# Patient Record
Sex: Female | Born: 1935 | Race: White | Hispanic: No | Smoking: Never smoker
Health system: Southern US, Community
[De-identification: ages and names within clinical notes are randomized; demographics above are authoritative.]

## PROBLEM LIST (undated history)

## (undated) DIAGNOSIS — I639 Cerebral infarction, unspecified: Secondary | ICD-10-CM

## (undated) DIAGNOSIS — N952 Postmenopausal atrophic vaginitis: Secondary | ICD-10-CM

## (undated) DIAGNOSIS — G441 Vascular headache, not elsewhere classified: Secondary | ICD-10-CM

## (undated) DIAGNOSIS — K5790 Diverticulosis of intestine, part unspecified, without perforation or abscess without bleeding: Secondary | ICD-10-CM

## (undated) DIAGNOSIS — M810 Age-related osteoporosis without current pathological fracture: Secondary | ICD-10-CM

## (undated) DIAGNOSIS — G459 Transient cerebral ischemic attack, unspecified: Secondary | ICD-10-CM

## (undated) DIAGNOSIS — B029 Zoster without complications: Secondary | ICD-10-CM

## (undated) DIAGNOSIS — N762 Acute vulvitis: Secondary | ICD-10-CM

## (undated) DIAGNOSIS — D099 Carcinoma in situ, unspecified: Secondary | ICD-10-CM

## (undated) DIAGNOSIS — E785 Hyperlipidemia, unspecified: Secondary | ICD-10-CM

## (undated) HISTORY — DX: Transient cerebral ischemic attack, unspecified: G45.9

## (undated) HISTORY — DX: Hyperlipidemia, unspecified: E78.5

## (undated) HISTORY — DX: Diverticulosis of intestine, part unspecified, without perforation or abscess without bleeding: K57.90

## (undated) HISTORY — DX: Acute vulvitis: N76.2

## (undated) HISTORY — DX: Gilbert syndrome: E80.4

## (undated) HISTORY — DX: Cerebral infarction, unspecified: I63.9

## (undated) HISTORY — DX: Age-related osteoporosis without current pathological fracture: M81.0

## (undated) HISTORY — DX: Vascular headache, not elsewhere classified: G44.1

## (undated) HISTORY — DX: Zoster without complications: B02.9

## (undated) HISTORY — DX: Postmenopausal atrophic vaginitis: N95.2

---
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
## 1997-11-03 ENCOUNTER — Other Ambulatory Visit: Admission: RE | Admit: 1997-11-03 | Discharge: 1997-11-03 | Payer: Self-pay | Admitting: *Deleted

## 1998-06-08 ENCOUNTER — Encounter: Payer: Self-pay | Admitting: Emergency Medicine

## 1998-06-08 ENCOUNTER — Emergency Department (HOSPITAL_COMMUNITY): Admission: EM | Admit: 1998-06-08 | Discharge: 1998-06-08 | Payer: Self-pay | Admitting: Emergency Medicine

## 1998-07-21 DIAGNOSIS — I639 Cerebral infarction, unspecified: Secondary | ICD-10-CM

## 1998-07-21 HISTORY — DX: Cerebral infarction, unspecified: I63.9

## 1999-01-09 ENCOUNTER — Other Ambulatory Visit: Admission: RE | Admit: 1999-01-09 | Discharge: 1999-01-09 | Payer: Self-pay | Admitting: *Deleted

## 1999-08-12 ENCOUNTER — Encounter: Admission: RE | Admit: 1999-08-12 | Discharge: 1999-08-12 | Payer: Self-pay | Admitting: *Deleted

## 1999-08-12 ENCOUNTER — Encounter: Payer: Self-pay | Admitting: *Deleted

## 2000-02-10 ENCOUNTER — Other Ambulatory Visit: Admission: RE | Admit: 2000-02-10 | Discharge: 2000-02-10 | Payer: Self-pay | Admitting: *Deleted

## 2000-08-18 ENCOUNTER — Encounter: Admission: RE | Admit: 2000-08-18 | Discharge: 2000-08-18 | Payer: Self-pay | Admitting: Internal Medicine

## 2000-08-18 ENCOUNTER — Encounter: Payer: Self-pay | Admitting: Internal Medicine

## 2001-02-15 ENCOUNTER — Other Ambulatory Visit: Admission: RE | Admit: 2001-02-15 | Discharge: 2001-02-15 | Payer: Self-pay | Admitting: *Deleted

## 2001-02-19 DIAGNOSIS — B029 Zoster without complications: Secondary | ICD-10-CM

## 2001-02-19 HISTORY — DX: Zoster without complications: B02.9

## 2001-08-17 ENCOUNTER — Encounter: Payer: Self-pay | Admitting: *Deleted

## 2001-08-17 ENCOUNTER — Encounter: Admission: RE | Admit: 2001-08-17 | Discharge: 2001-08-17 | Payer: Self-pay | Admitting: *Deleted

## 2002-02-21 ENCOUNTER — Other Ambulatory Visit: Admission: RE | Admit: 2002-02-21 | Discharge: 2002-02-21 | Payer: Self-pay | Admitting: *Deleted

## 2002-04-21 DIAGNOSIS — N762 Acute vulvitis: Secondary | ICD-10-CM

## 2002-04-21 HISTORY — DX: Acute vulvitis: N76.2

## 2002-08-22 ENCOUNTER — Encounter: Payer: Self-pay | Admitting: *Deleted

## 2002-08-22 ENCOUNTER — Encounter: Admission: RE | Admit: 2002-08-22 | Discharge: 2002-08-22 | Payer: Self-pay | Admitting: *Deleted

## 2003-03-08 ENCOUNTER — Other Ambulatory Visit: Admission: RE | Admit: 2003-03-08 | Discharge: 2003-03-08 | Payer: Self-pay | Admitting: *Deleted

## 2003-04-22 HISTORY — PX: CATARACT EXTRACTION: SUR2

## 2003-08-24 ENCOUNTER — Encounter: Admission: RE | Admit: 2003-08-24 | Discharge: 2003-08-24 | Payer: Self-pay | Admitting: Internal Medicine

## 2004-08-26 ENCOUNTER — Encounter: Admission: RE | Admit: 2004-08-26 | Discharge: 2004-08-26 | Payer: Self-pay | Admitting: *Deleted

## 2005-03-26 ENCOUNTER — Other Ambulatory Visit: Admission: RE | Admit: 2005-03-26 | Discharge: 2005-03-26 | Payer: Self-pay | Admitting: Obstetrics and Gynecology

## 2005-08-28 ENCOUNTER — Encounter: Admission: RE | Admit: 2005-08-28 | Discharge: 2005-08-28 | Payer: Self-pay | Admitting: Internal Medicine

## 2005-09-10 ENCOUNTER — Encounter: Admission: RE | Admit: 2005-09-10 | Discharge: 2005-09-10 | Payer: Self-pay | Admitting: Internal Medicine

## 2005-09-17 ENCOUNTER — Encounter: Admission: RE | Admit: 2005-09-17 | Discharge: 2005-09-17 | Payer: Self-pay | Admitting: Neurology

## 2006-08-11 ENCOUNTER — Other Ambulatory Visit: Admission: RE | Admit: 2006-08-11 | Discharge: 2006-08-11 | Payer: Self-pay | Admitting: Obstetrics and Gynecology

## 2006-09-15 ENCOUNTER — Encounter: Admission: RE | Admit: 2006-09-15 | Discharge: 2006-09-15 | Payer: Self-pay | Admitting: Obstetrics and Gynecology

## 2006-10-21 ENCOUNTER — Ambulatory Visit: Payer: Self-pay | Admitting: Internal Medicine

## 2006-11-06 ENCOUNTER — Ambulatory Visit: Payer: Self-pay | Admitting: Internal Medicine

## 2007-08-20 ENCOUNTER — Other Ambulatory Visit: Admission: RE | Admit: 2007-08-20 | Discharge: 2007-08-20 | Payer: Self-pay | Admitting: Obstetrics and Gynecology

## 2007-09-20 ENCOUNTER — Encounter: Admission: RE | Admit: 2007-09-20 | Discharge: 2007-09-20 | Payer: Self-pay | Admitting: Internal Medicine

## 2008-09-26 ENCOUNTER — Encounter: Admission: RE | Admit: 2008-09-26 | Discharge: 2008-09-26 | Payer: Self-pay | Admitting: Internal Medicine

## 2008-10-12 ENCOUNTER — Encounter: Admission: RE | Admit: 2008-10-12 | Discharge: 2008-10-12 | Payer: Self-pay | Admitting: Neurology

## 2008-11-22 ENCOUNTER — Encounter: Admission: RE | Admit: 2008-11-22 | Discharge: 2009-02-20 | Payer: Self-pay | Admitting: Internal Medicine

## 2009-03-09 ENCOUNTER — Encounter: Admission: RE | Admit: 2009-03-09 | Discharge: 2009-04-17 | Payer: Self-pay | Admitting: Internal Medicine

## 2009-09-26 ENCOUNTER — Encounter: Admission: RE | Admit: 2009-09-26 | Discharge: 2009-09-26 | Payer: Self-pay | Admitting: Internal Medicine

## 2010-01-03 ENCOUNTER — Encounter: Admission: RE | Admit: 2010-01-03 | Discharge: 2010-01-03 | Payer: Self-pay | Admitting: Internal Medicine

## 2010-05-12 ENCOUNTER — Encounter: Payer: Self-pay | Admitting: Internal Medicine

## 2010-07-15 ENCOUNTER — Other Ambulatory Visit: Payer: Self-pay | Admitting: Rheumatology

## 2010-07-15 ENCOUNTER — Ambulatory Visit
Admission: RE | Admit: 2010-07-15 | Discharge: 2010-07-15 | Disposition: A | Discharge status: Home / Self Care | Payer: Medicare Other | Source: Ambulatory Visit | Attending: Rheumatology | Admitting: Rheumatology

## 2010-09-17 ENCOUNTER — Other Ambulatory Visit: Payer: Self-pay | Admitting: Obstetrics & Gynecology

## 2010-09-17 DIAGNOSIS — N6321 Unspecified lump in the left breast, upper outer quadrant: Secondary | ICD-10-CM

## 2010-10-03 ENCOUNTER — Other Ambulatory Visit: Payer: Self-pay | Admitting: Obstetrics & Gynecology

## 2010-10-03 DIAGNOSIS — N6321 Unspecified lump in the left breast, upper outer quadrant: Secondary | ICD-10-CM

## 2010-10-10 ENCOUNTER — Ambulatory Visit
Admission: RE | Admit: 2010-10-10 | Discharge: 2010-10-10 | Disposition: A | Discharge status: Home / Self Care | Payer: Medicare Other | Source: Ambulatory Visit | Attending: Obstetrics & Gynecology | Admitting: Obstetrics & Gynecology

## 2010-10-10 DIAGNOSIS — N6321 Unspecified lump in the left breast, upper outer quadrant: Secondary | ICD-10-CM

## 2010-12-04 ENCOUNTER — Other Ambulatory Visit: Payer: Self-pay | Admitting: Obstetrics & Gynecology

## 2010-12-04 DIAGNOSIS — Z1231 Encounter for screening mammogram for malignant neoplasm of breast: Secondary | ICD-10-CM

## 2011-01-07 ENCOUNTER — Ambulatory Visit: Payer: Medicare Other

## 2011-01-14 ENCOUNTER — Emergency Department (HOSPITAL_COMMUNITY): Payer: Medicare Other

## 2011-01-14 ENCOUNTER — Inpatient Hospital Stay (HOSPITAL_COMMUNITY)
Admission: EM | Admit: 2011-01-14 | Discharge: 2011-01-20 | DRG: 563 | Disposition: A | Discharge status: Skilled Nursing Facility | Payer: Medicare Other | Attending: Internal Medicine | Admitting: Internal Medicine

## 2011-01-14 DIAGNOSIS — S42293A Other displaced fracture of upper end of unspecified humerus, initial encounter for closed fracture: Principal | ICD-10-CM | POA: Diagnosis present

## 2011-01-14 DIAGNOSIS — Z23 Encounter for immunization: Secondary | ICD-10-CM

## 2011-01-14 DIAGNOSIS — M19019 Primary osteoarthritis, unspecified shoulder: Secondary | ICD-10-CM | POA: Diagnosis present

## 2011-01-14 DIAGNOSIS — Y92009 Unspecified place in unspecified non-institutional (private) residence as the place of occurrence of the external cause: Secondary | ICD-10-CM

## 2011-01-14 DIAGNOSIS — R55 Syncope and collapse: Secondary | ICD-10-CM | POA: Diagnosis present

## 2011-01-14 DIAGNOSIS — W19XXXA Unspecified fall, initial encounter: Secondary | ICD-10-CM | POA: Diagnosis present

## 2011-01-14 DIAGNOSIS — I10 Essential (primary) hypertension: Secondary | ICD-10-CM | POA: Diagnosis present

## 2011-01-14 DIAGNOSIS — Y93K1 Activity, walking an animal: Secondary | ICD-10-CM

## 2011-01-14 DIAGNOSIS — K5900 Constipation, unspecified: Secondary | ICD-10-CM | POA: Diagnosis not present

## 2011-01-14 DIAGNOSIS — Z7982 Long term (current) use of aspirin: Secondary | ICD-10-CM

## 2011-01-14 DIAGNOSIS — E785 Hyperlipidemia, unspecified: Secondary | ICD-10-CM | POA: Diagnosis present

## 2011-01-14 DIAGNOSIS — Z79899 Other long term (current) drug therapy: Secondary | ICD-10-CM

## 2011-01-15 ENCOUNTER — Emergency Department (HOSPITAL_COMMUNITY): Payer: Medicare Other

## 2011-01-15 DIAGNOSIS — R55 Syncope and collapse: Secondary | ICD-10-CM

## 2011-01-15 LAB — DIFFERENTIAL
Basophils Relative: 0 % (ref 0–1)
Eosinophils Absolute: 0.1 10*3/uL (ref 0.0–0.7)
Eosinophils Relative: 1 % (ref 0–5)
Lymphocytes Relative: 15 % (ref 12–46)
Lymphs Abs: 2.7 10*3/uL (ref 0.7–4.0)
Monocytes Relative: 9 % (ref 3–12)
Neutro Abs: 13.4 10*3/uL — ABNORMAL HIGH (ref 1.7–7.7)
Neutrophils Relative %: 75 % (ref 43–77)

## 2011-01-15 LAB — CBC
HCT: 38 % (ref 36.0–46.0)
MCH: 29.3 pg (ref 26.0–34.0)
MCHC: 32.4 g/dL (ref 30.0–36.0)
MCHC: 32.7 g/dL (ref 30.0–36.0)
MCV: 90.5 fL (ref 78.0–100.0)
Platelets: 222 10*3/uL (ref 150–400)
RBC: 4.68 MIL/uL (ref 3.87–5.11)
RDW: 14.3 % (ref 11.5–15.5)
RDW: 14.4 % (ref 11.5–15.5)
WBC: 11.2 10*3/uL — ABNORMAL HIGH (ref 4.0–10.5)

## 2011-01-15 LAB — BASIC METABOLIC PANEL
BUN: 23 mg/dL (ref 6–23)
CO2: 29 mEq/L (ref 19–32)
Calcium: 8.8 mg/dL (ref 8.4–10.5)
Calcium: 9.2 mg/dL (ref 8.4–10.5)
Chloride: 105 mEq/L (ref 96–112)
Creatinine, Ser: 0.71 mg/dL (ref 0.50–1.10)
Creatinine, Ser: 0.95 mg/dL (ref 0.50–1.10)
GFR calc Af Amer: >60 mL/min (ref >60)
GFR calc Af Amer: >60 mL/min (ref >60)
Sodium: 142 mEq/L (ref 135–145)

## 2011-01-15 LAB — URINALYSIS, ROUTINE W REFLEX MICROSCOPIC
Bilirubin Urine: NEGATIVE
Glucose, UA: NEGATIVE mg/dL
Hgb urine dipstick: NEGATIVE
Ketones, ur: 15 mg/dL — AB
Protein, ur: NEGATIVE mg/dL
Urobilinogen, UA: 0.2 mg/dL (ref 0.0–1.0)

## 2011-01-15 LAB — CK TOTAL AND CKMB (NOT AT ARMC)
CK, MB: 2.3 ng/mL (ref 0.3–4.0)
Total CK: 66 U/L (ref 7–177)

## 2011-01-15 LAB — D-DIMER, QUANTITATIVE: D-Dimer, Quant: 7.22 ug/mL-FEU — ABNORMAL HIGH (ref 0.00–0.48)

## 2011-01-15 LAB — POCT I-STAT TROPONIN I

## 2011-01-15 LAB — LIPID PANEL: Total CHOL/HDL Ratio: 2.3 RATIO

## 2011-01-15 LAB — TSH: TSH: 2.727 u[IU]/mL (ref 0.350–4.500)

## 2011-01-15 MED ORDER — IOHEXOL 300 MG/ML  SOLN
100.0000 mL | Freq: Once | INTRAMUSCULAR | Status: AC | PRN
  Administered 2011-01-15: 100 mL via INTRAVENOUS

## 2011-01-15 NOTE — H&P (Signed)
[name], NISTLER               ACCOUNT NO.:  [id number]  MEDICAL RECORD NO.:  [id number]  LOCATION:  [facility]                         FACILITY:  [facility]  PHYSICIAN:  Gery Pray, MD      DATE OF BIRTH:  22-Jul-1925  DATE OF ADMISSION:  01/14/2011 DATE OF DISCHARGE:                             HISTORY & PHYSICAL   PRIMARY CARE PHYSICIAN:  Soyla Murphy. [name], MD  The patient goes to team 4.  CHIEF COMPLAINT:  Fall.  HISTORY OF PRESENT ILLNESS:  This is a rather pleasant, active 77 year old female who lives alone.  She states that today she was out walking her kittens, she saw something, bent over to get it, and lost consciousness.  She is unclear how long she was down for.  It was a few seconds or a few minutes.  She reports that prior to this she had no fevers, no chills.  No nausea or vomiting.  No altered mental status. No burning urination.  No cough.  No localized weakness.  No slurred speech.  No dysphagia after the episode.  She has no chest pain.  She states she has been feeling fairly tired recently.  She has a decreased appetite.  She states she believes she felt a little presyncopal 2 weeks ago.    911 was called.  She was brought to the ER.  She is unclear who called 911.   She states she is on no new medications.  No change in medications.  History obtained from the patient who appears reliable.  REVIEW OF SYSTEMS:  All 10-point systems was reviewed and negative except as noted in HPI.  PAST MEDICAL HISTORY:  Dyslipidemia.  PAST SURGICAL HISTORY:  None.  MEDICATIONS:  Crestor and aspirin, multivitamins.  ALLERGIES:  PENICILLIN.  SOCIAL HISTORY:  Negative for tobacco, alcohol, or illicit drugs.  The patient lives alone.  She does not have home oxygen.  She uses a walker or a cane occasionally.  She gets meals from [employer].  She has no other support.  FAMILY HISTORY:  Significant for hypertension, CVA.  PHYSICAL EXAMINATION:  VITAL SIGNS:  Blood  pressure 142/118 repeat 142/67, pulse 78, respirations 20, temperature 98.2, sat is 97% on room air. GENERAL:  Alert, oriented, pleasant female. HEENT:  Eyes, pink conjunctivae.  PERRLA.  ENT: Moist oral mucosa. Trachea midline. NECK:  Supple.  No thyromegaly. LUNGS:  Clear to auscultation bilaterally.  No use of accessory muscles. No wheeze. CARDIOVASCULAR:  Regular rate and rhythm without murmurs, rigors, or gallops.  No JVD.  No carotid bruits. ABDOMEN:  Soft, positive bowel sounds, nontender, nondistended.  No organomegaly. NEURO:  Cranial nerves II through XII grossly intact.  Sensation intact. MUSCULOSKELETAL:  Right upper extremity strength not assessed due to fracture, otherwise 5/5 in all extremities.  No clubbing, cyanosis, or edema.  The patient's right upper extremity does have good grip. SKIN:  No rashes.  No subcutaneous crepitation. PSYCH:  Alert, oriented appropriate female.  LABORATORY DATA:  Right shoulder x-ray shows marked right shoulder DJD. Right humerus shows mild displaced fracture to the surgical neck, DJD again noted as well as a large osteophyte at the humeral head.  Chest x- ray shows mild bibasilar atelectasis, otherwise normal.  There is also a question of right ninth rib fracture, likely mild chronic compression fracture along the low thoracic spine.  CT head, no evidence of traumatic intracranial injury.  Cervical spine series shows no evidence of fracture, subluxation.  CT head shows no evidence of intracranial fracture.  CT chest, anterior is negative for PE.  White blood count 17.9, hemoglobin 13.9, platelets 244, troponin 0.01.  Sodium 142, potassium 3.8, chloride 104, CO2 of 29, glucose 113, BUN 28, creatinine 0.95.  EKG normal sinus rhythm.  ASSESSMENT AND PLAN: 1. Syncope, unclear etiology.  The patient will be admitted for     syncopal workup which will include carotid ultrasound and monitor     on tele.  The patient already received CT  head. 2. Right humeral fracture.  The ER physician did contact the cross     cover for Dr. Shelle Iron who states that there is nothing much to do.     The patient currently is in a sling.  Recommend outpatient followup     with Dr. Shelle Iron in 1 week.  The patient lives alone.  Therefore     activities may be an issue.  Therefore, PT will be consulted.     Social worker came afterwards to evaluate for placement home versus     assisted living or otherwise. 3. Dyslipidemia.  Continue statins.  Pain medications will be ordered. 4. Marked osteoarthritis.  Pain medications will be ordered.          ______________________________ Gery Pray, MD     DC/MEDQ  D:  01/15/2011  T:  01/15/2011  Job:  [id number]  Electronically Signed by Gery Pray MD on 01/15/2011 05:59:01 AM

## 2011-01-16 LAB — BASIC METABOLIC PANEL WITH GFR
BUN: 21 mg/dL (ref 6–23)
CO2: 29 meq/L (ref 19–32)
Calcium: 8.9 mg/dL (ref 8.4–10.5)
Chloride: 106 meq/L (ref 96–112)
Creatinine, Ser: 0.66 mg/dL (ref 0.50–1.10)
GFR calc Af Amer: >60 mL/min
GFR calc non Af Amer: >60 mL/min
Glucose, Bld: 106 mg/dL — ABNORMAL HIGH (ref 70–99)
Potassium: 4.1 meq/L (ref 3.5–5.1)
Sodium: 141 meq/L (ref 135–145)

## 2011-01-16 LAB — CBC
HCT: 38.2 % (ref 36.0–46.0)
Hemoglobin: 12 g/dL (ref 12.0–15.0)
MCH: 28.5 pg (ref 26.0–34.0)
MCHC: 31.4 g/dL (ref 30.0–36.0)
MCV: 90.7 fL (ref 78.0–100.0)
Platelets: 201 10*3/uL (ref 150–400)
RBC: 4.21 MIL/uL (ref 3.87–5.11)
RDW: 14.4 % (ref 11.5–15.5)
WBC: 10.4 10*3/uL (ref 4.0–10.5)

## 2011-01-22 NOTE — Discharge Summary (Signed)
  [name], [name]               ACCOUNT NO.:  [id number]  MEDICAL RECORD NO.:  [id number]  LOCATION:  3701                         FACILITY:  [facility]  PHYSICIAN:  Caroline Ranger, MD       DATE OF BIRTH:  10-04-1925  DATE OF ADMISSION:  01/14/2011 DATE OF DISCHARGE:                              DISCHARGE SUMMARY   PRIMARY CARE PHYSICIAN:  Caroline Murphy. [name], MD  DISCHARGE FINAL DIAGNOSES: 1. Syncope probable vasovagal episode. 2. Fall with right humeral fracture. 3. Dyslipidemia. 4. Osteoarthritis. 5. Constipation.  DISCHARGE MEDICATIONS: 1. Acetaminophen 650 mg p.o. Curtis 4 h. as needed. 2. Percocet 7.5/500 one tablet p.o. Curtis 6 h. as needed for pain. 3. MiraLax 17 g p.o. daily as needed for constipation. 4. Senokot 2 tabs p.o. daily as needed for constipation. 5. Aspirin 81 mg p.o. daily. 6. Crestor 1 tablet p.o. daily. 7. Fish oil 1 tablet p.o. daily. 8. Mirtazapine 15 mg p.o. daily at bedtime. 9. Multivitamin 1 tablet p.o. daily.  REVIEW OF SYSTEMS:  Please refer to the previously dictated discharge summaries by Dr. Mliss Sax.  For details of the hospitalization, the patient was awaiting the short-term rehab bed availability.  In the interim, the patient also had constipation which was resolved with the MiraLax.  The patient will be discharged to the rehab with home made services.  Right humerus fracture.  The patient will follow up with [name] in outpatient.  She will continue to wear a sling and pain control and physical therapy.  Vital signs at the time of my dictation; temperature 97.9, pulse 62, respirations 20, blood pressure 134/72 and O2 sats 93% on room air. General, the patient is alert, awake and oriented x3 not in acute distress.  Pupils reactive to light and accommodation.  EOMI.  Neck is supple.  No lymphadenopathy.  CVS, clear.  Chest clear to auscultation bilaterally.  Abdomen soft, nontender, nondistended and normoactive bowel sounds.   Extremities right arm in sling.  No sinus, clubbing or edema noted in upper or lower extremities bilaterally.  DISCHARGE FOLLOWUP:  With [name] on January 27, 2011, at 8 a.m. and discharge followup with primary care physician Dr. Merri Curtis in next 2 weeks for the followup.  DISCHARGE TIME:  35 minutes.     Caroline Ranger, MD     RR/MEDQ  D:  01/20/2011  T:  01/20/2011  Job:  [id number]  cc:   Jene Curtis, M.D. [name], M.D.  Electronically Signed by [name]  on 01/22/2011 03:16:06 PM

## 2011-04-28 DIAGNOSIS — F070 Personality change due to known physiological condition: Secondary | ICD-10-CM | POA: Diagnosis not present

## 2011-05-02 ENCOUNTER — Ambulatory Visit (INDEPENDENT_AMBULATORY_CARE_PROVIDER_SITE_OTHER): Payer: Medicare Other | Admitting: Ophthalmology

## 2011-05-02 DIAGNOSIS — H3530 Unspecified macular degeneration: Secondary | ICD-10-CM | POA: Diagnosis not present

## 2011-05-02 DIAGNOSIS — H43819 Vitreous degeneration, unspecified eye: Secondary | ICD-10-CM

## 2011-05-07 ENCOUNTER — Other Ambulatory Visit: Payer: Self-pay | Admitting: Internal Medicine

## 2011-05-07 DIAGNOSIS — R413 Other amnesia: Secondary | ICD-10-CM

## 2011-05-12 DIAGNOSIS — M47817 Spondylosis without myelopathy or radiculopathy, lumbosacral region: Secondary | ICD-10-CM | POA: Diagnosis not present

## 2011-05-13 ENCOUNTER — Ambulatory Visit
Admission: RE | Admit: 2011-05-13 | Discharge: 2011-05-13 | Disposition: A | Discharge status: Home / Self Care | Payer: Medicare Other | Source: Ambulatory Visit | Attending: Internal Medicine | Admitting: Internal Medicine

## 2011-05-13 DIAGNOSIS — R413 Other amnesia: Secondary | ICD-10-CM | POA: Diagnosis not present

## 2011-05-13 DIAGNOSIS — G319 Degenerative disease of nervous system, unspecified: Secondary | ICD-10-CM | POA: Diagnosis not present

## 2011-05-13 DIAGNOSIS — I6789 Other cerebrovascular disease: Secondary | ICD-10-CM | POA: Diagnosis not present

## 2011-06-05 DIAGNOSIS — F411 Generalized anxiety disorder: Secondary | ICD-10-CM | POA: Diagnosis not present

## 2011-06-05 DIAGNOSIS — R413 Other amnesia: Secondary | ICD-10-CM | POA: Diagnosis not present

## 2011-07-15 DIAGNOSIS — H04129 Dry eye syndrome of unspecified lacrimal gland: Secondary | ICD-10-CM | POA: Diagnosis not present

## 2011-08-06 DIAGNOSIS — G2581 Restless legs syndrome: Secondary | ICD-10-CM | POA: Diagnosis not present

## 2011-08-06 DIAGNOSIS — I8390 Asymptomatic varicose veins of unspecified lower extremity: Secondary | ICD-10-CM | POA: Diagnosis not present

## 2011-09-04 DIAGNOSIS — R413 Other amnesia: Secondary | ICD-10-CM | POA: Diagnosis not present

## 2011-09-04 DIAGNOSIS — F411 Generalized anxiety disorder: Secondary | ICD-10-CM | POA: Diagnosis not present

## 2011-09-04 DIAGNOSIS — E7800 Pure hypercholesterolemia, unspecified: Secondary | ICD-10-CM | POA: Diagnosis not present

## 2011-09-25 DIAGNOSIS — Z124 Encounter for screening for malignant neoplasm of cervix: Secondary | ICD-10-CM | POA: Diagnosis not present

## 2011-09-25 DIAGNOSIS — B3731 Acute candidiasis of vulva and vagina: Secondary | ICD-10-CM | POA: Diagnosis not present

## 2011-09-25 DIAGNOSIS — B373 Candidiasis of vulva and vagina: Secondary | ICD-10-CM | POA: Diagnosis not present

## 2011-09-25 DIAGNOSIS — Z01419 Encounter for gynecological examination (general) (routine) without abnormal findings: Secondary | ICD-10-CM | POA: Diagnosis not present

## 2011-12-02 DIAGNOSIS — M25819 Other specified joint disorders, unspecified shoulder: Secondary | ICD-10-CM | POA: Diagnosis not present

## 2011-12-02 DIAGNOSIS — M25519 Pain in unspecified shoulder: Secondary | ICD-10-CM | POA: Diagnosis not present

## 2011-12-02 DIAGNOSIS — M19019 Primary osteoarthritis, unspecified shoulder: Secondary | ICD-10-CM | POA: Diagnosis not present

## 2011-12-09 DIAGNOSIS — M25519 Pain in unspecified shoulder: Secondary | ICD-10-CM | POA: Diagnosis not present

## 2011-12-16 DIAGNOSIS — M19019 Primary osteoarthritis, unspecified shoulder: Secondary | ICD-10-CM | POA: Diagnosis not present

## 2011-12-18 DIAGNOSIS — M4120 Other idiopathic scoliosis, site unspecified: Secondary | ICD-10-CM | POA: Diagnosis not present

## 2011-12-18 DIAGNOSIS — M4716 Other spondylosis with myelopathy, lumbar region: Secondary | ICD-10-CM | POA: Diagnosis not present

## 2011-12-18 DIAGNOSIS — M5137 Other intervertebral disc degeneration, lumbosacral region: Secondary | ICD-10-CM | POA: Diagnosis not present

## 2011-12-18 DIAGNOSIS — M25519 Pain in unspecified shoulder: Secondary | ICD-10-CM | POA: Diagnosis not present

## 2012-01-06 DIAGNOSIS — M4716 Other spondylosis with myelopathy, lumbar region: Secondary | ICD-10-CM | POA: Diagnosis not present

## 2012-01-06 DIAGNOSIS — M25519 Pain in unspecified shoulder: Secondary | ICD-10-CM | POA: Diagnosis not present

## 2012-01-06 DIAGNOSIS — M4120 Other idiopathic scoliosis, site unspecified: Secondary | ICD-10-CM | POA: Diagnosis not present

## 2012-01-06 DIAGNOSIS — M5137 Other intervertebral disc degeneration, lumbosacral region: Secondary | ICD-10-CM | POA: Diagnosis not present

## 2012-01-19 DIAGNOSIS — M545 Low back pain, unspecified: Secondary | ICD-10-CM | POA: Diagnosis not present

## 2012-01-27 DIAGNOSIS — M25519 Pain in unspecified shoulder: Secondary | ICD-10-CM | POA: Diagnosis not present

## 2012-01-27 DIAGNOSIS — M4120 Other idiopathic scoliosis, site unspecified: Secondary | ICD-10-CM | POA: Diagnosis not present

## 2012-01-27 DIAGNOSIS — M4716 Other spondylosis with myelopathy, lumbar region: Secondary | ICD-10-CM | POA: Diagnosis not present

## 2012-01-27 DIAGNOSIS — M5137 Other intervertebral disc degeneration, lumbosacral region: Secondary | ICD-10-CM | POA: Diagnosis not present

## 2012-02-20 DIAGNOSIS — Q675 Congenital deformity of spine: Secondary | ICD-10-CM | POA: Diagnosis not present

## 2012-02-20 DIAGNOSIS — M5137 Other intervertebral disc degeneration, lumbosacral region: Secondary | ICD-10-CM | POA: Diagnosis not present

## 2012-02-26 DIAGNOSIS — Q675 Congenital deformity of spine: Secondary | ICD-10-CM | POA: Diagnosis not present

## 2012-02-26 DIAGNOSIS — M5137 Other intervertebral disc degeneration, lumbosacral region: Secondary | ICD-10-CM | POA: Diagnosis not present

## 2012-02-27 DIAGNOSIS — Q675 Congenital deformity of spine: Secondary | ICD-10-CM | POA: Diagnosis not present

## 2012-02-27 DIAGNOSIS — M5137 Other intervertebral disc degeneration, lumbosacral region: Secondary | ICD-10-CM | POA: Diagnosis not present

## 2012-03-01 DIAGNOSIS — Q675 Congenital deformity of spine: Secondary | ICD-10-CM | POA: Diagnosis not present

## 2012-03-01 DIAGNOSIS — M5137 Other intervertebral disc degeneration, lumbosacral region: Secondary | ICD-10-CM | POA: Diagnosis not present

## 2012-03-03 DIAGNOSIS — Q675 Congenital deformity of spine: Secondary | ICD-10-CM | POA: Diagnosis not present

## 2012-03-03 DIAGNOSIS — M5137 Other intervertebral disc degeneration, lumbosacral region: Secondary | ICD-10-CM | POA: Diagnosis not present

## 2012-03-09 DIAGNOSIS — M4120 Other idiopathic scoliosis, site unspecified: Secondary | ICD-10-CM | POA: Diagnosis not present

## 2012-03-09 DIAGNOSIS — G894 Chronic pain syndrome: Secondary | ICD-10-CM | POA: Diagnosis not present

## 2012-03-09 DIAGNOSIS — M5137 Other intervertebral disc degeneration, lumbosacral region: Secondary | ICD-10-CM | POA: Diagnosis not present

## 2012-03-09 DIAGNOSIS — M810 Age-related osteoporosis without current pathological fracture: Secondary | ICD-10-CM | POA: Diagnosis not present

## 2012-03-15 DIAGNOSIS — N390 Urinary tract infection, site not specified: Secondary | ICD-10-CM | POA: Diagnosis not present

## 2012-03-15 DIAGNOSIS — R32 Unspecified urinary incontinence: Secondary | ICD-10-CM | POA: Diagnosis not present

## 2012-03-15 DIAGNOSIS — R350 Frequency of micturition: Secondary | ICD-10-CM | POA: Diagnosis not present

## 2012-03-15 DIAGNOSIS — N3000 Acute cystitis without hematuria: Secondary | ICD-10-CM | POA: Diagnosis not present

## 2012-04-20 DIAGNOSIS — R32 Unspecified urinary incontinence: Secondary | ICD-10-CM | POA: Diagnosis not present

## 2012-04-22 DIAGNOSIS — E7800 Pure hypercholesterolemia, unspecified: Secondary | ICD-10-CM | POA: Diagnosis not present

## 2012-04-22 DIAGNOSIS — M4120 Other idiopathic scoliosis, site unspecified: Secondary | ICD-10-CM | POA: Diagnosis not present

## 2012-05-03 ENCOUNTER — Ambulatory Visit (INDEPENDENT_AMBULATORY_CARE_PROVIDER_SITE_OTHER): Payer: Medicare Other | Admitting: Ophthalmology

## 2012-05-20 DIAGNOSIS — M4120 Other idiopathic scoliosis, site unspecified: Secondary | ICD-10-CM | POA: Diagnosis not present

## 2012-05-20 DIAGNOSIS — M5137 Other intervertebral disc degeneration, lumbosacral region: Secondary | ICD-10-CM | POA: Diagnosis not present

## 2012-05-25 DIAGNOSIS — R32 Unspecified urinary incontinence: Secondary | ICD-10-CM | POA: Diagnosis not present

## 2012-06-10 DIAGNOSIS — N3941 Urge incontinence: Secondary | ICD-10-CM | POA: Diagnosis not present

## 2012-06-10 DIAGNOSIS — B009 Herpesviral infection, unspecified: Secondary | ICD-10-CM | POA: Diagnosis not present

## 2012-06-10 DIAGNOSIS — N3942 Incontinence without sensory awareness: Secondary | ICD-10-CM | POA: Diagnosis not present

## 2012-06-10 DIAGNOSIS — N393 Stress incontinence (female) (male): Secondary | ICD-10-CM | POA: Diagnosis not present

## 2012-06-14 DIAGNOSIS — Z79899 Other long term (current) drug therapy: Secondary | ICD-10-CM | POA: Diagnosis not present

## 2012-06-14 DIAGNOSIS — M4120 Other idiopathic scoliosis, site unspecified: Secondary | ICD-10-CM | POA: Diagnosis not present

## 2012-06-14 DIAGNOSIS — L259 Unspecified contact dermatitis, unspecified cause: Secondary | ICD-10-CM | POA: Diagnosis not present

## 2012-06-16 DIAGNOSIS — L259 Unspecified contact dermatitis, unspecified cause: Secondary | ICD-10-CM | POA: Diagnosis not present

## 2012-06-17 DIAGNOSIS — F411 Generalized anxiety disorder: Secondary | ICD-10-CM | POA: Diagnosis not present

## 2012-06-17 DIAGNOSIS — L259 Unspecified contact dermatitis, unspecified cause: Secondary | ICD-10-CM | POA: Diagnosis not present

## 2012-07-02 DIAGNOSIS — L259 Unspecified contact dermatitis, unspecified cause: Secondary | ICD-10-CM | POA: Diagnosis not present

## 2012-08-31 DIAGNOSIS — Z0000 Encounter for general adult medical examination without abnormal findings: Secondary | ICD-10-CM | POA: Diagnosis not present

## 2012-09-01 DIAGNOSIS — M4120 Other idiopathic scoliosis, site unspecified: Secondary | ICD-10-CM | POA: Diagnosis not present

## 2012-09-01 DIAGNOSIS — I70209 Unspecified atherosclerosis of native arteries of extremities, unspecified extremity: Secondary | ICD-10-CM | POA: Diagnosis not present

## 2012-09-01 DIAGNOSIS — R32 Unspecified urinary incontinence: Secondary | ICD-10-CM | POA: Diagnosis not present

## 2012-09-01 DIAGNOSIS — L570 Actinic keratosis: Secondary | ICD-10-CM | POA: Diagnosis not present

## 2012-09-01 DIAGNOSIS — M810 Age-related osteoporosis without current pathological fracture: Secondary | ICD-10-CM | POA: Diagnosis not present

## 2012-09-16 DIAGNOSIS — M5137 Other intervertebral disc degeneration, lumbosacral region: Secondary | ICD-10-CM | POA: Diagnosis not present

## 2012-09-16 DIAGNOSIS — M4716 Other spondylosis with myelopathy, lumbar region: Secondary | ICD-10-CM | POA: Diagnosis not present

## 2012-09-16 DIAGNOSIS — M4120 Other idiopathic scoliosis, site unspecified: Secondary | ICD-10-CM | POA: Diagnosis not present

## 2012-09-16 DIAGNOSIS — G894 Chronic pain syndrome: Secondary | ICD-10-CM | POA: Diagnosis not present

## 2012-09-27 ENCOUNTER — Encounter: Payer: Self-pay | Admitting: Certified Nurse Midwife

## 2012-09-30 ENCOUNTER — Ambulatory Visit: Payer: Self-pay | Admitting: Certified Nurse Midwife

## 2012-09-30 ENCOUNTER — Encounter: Payer: Self-pay | Admitting: Certified Nurse Midwife

## 2012-10-19 DIAGNOSIS — B370 Candidal stomatitis: Secondary | ICD-10-CM | POA: Diagnosis not present

## 2012-10-19 DIAGNOSIS — Z006 Encounter for examination for normal comparison and control in clinical research program: Secondary | ICD-10-CM | POA: Diagnosis not present

## 2012-10-19 DIAGNOSIS — H3530 Unspecified macular degeneration: Secondary | ICD-10-CM | POA: Diagnosis not present

## 2012-10-19 DIAGNOSIS — Z01419 Encounter for gynecological examination (general) (routine) without abnormal findings: Secondary | ICD-10-CM | POA: Diagnosis not present

## 2012-10-19 DIAGNOSIS — M810 Age-related osteoporosis without current pathological fracture: Secondary | ICD-10-CM | POA: Diagnosis not present

## 2012-10-19 DIAGNOSIS — B3731 Acute candidiasis of vulva and vagina: Secondary | ICD-10-CM | POA: Diagnosis not present

## 2012-10-19 DIAGNOSIS — B373 Candidiasis of vulva and vagina: Secondary | ICD-10-CM | POA: Diagnosis not present

## 2012-11-18 DIAGNOSIS — M4716 Other spondylosis with myelopathy, lumbar region: Secondary | ICD-10-CM | POA: Diagnosis not present

## 2012-11-18 DIAGNOSIS — M4120 Other idiopathic scoliosis, site unspecified: Secondary | ICD-10-CM | POA: Diagnosis not present

## 2012-11-18 DIAGNOSIS — G894 Chronic pain syndrome: Secondary | ICD-10-CM | POA: Diagnosis not present

## 2012-11-18 DIAGNOSIS — M5137 Other intervertebral disc degeneration, lumbosacral region: Secondary | ICD-10-CM | POA: Diagnosis not present

## 2012-12-17 DIAGNOSIS — M545 Low back pain, unspecified: Secondary | ICD-10-CM | POA: Diagnosis not present

## 2012-12-22 DIAGNOSIS — M545 Low back pain, unspecified: Secondary | ICD-10-CM | POA: Diagnosis not present

## 2012-12-29 DIAGNOSIS — M545 Low back pain, unspecified: Secondary | ICD-10-CM | POA: Diagnosis not present

## 2013-01-03 DIAGNOSIS — M545 Low back pain, unspecified: Secondary | ICD-10-CM | POA: Diagnosis not present

## 2013-01-10 DIAGNOSIS — M545 Low back pain, unspecified: Secondary | ICD-10-CM | POA: Diagnosis not present

## 2013-01-28 DIAGNOSIS — M4120 Other idiopathic scoliosis, site unspecified: Secondary | ICD-10-CM | POA: Diagnosis not present

## 2013-01-28 DIAGNOSIS — M5137 Other intervertebral disc degeneration, lumbosacral region: Secondary | ICD-10-CM | POA: Diagnosis not present

## 2013-04-06 DIAGNOSIS — Z961 Presence of intraocular lens: Secondary | ICD-10-CM | POA: Diagnosis not present

## 2013-04-06 DIAGNOSIS — H353190 Nonexudative age-related macular degeneration, unspecified eye, stage unspecified: Secondary | ICD-10-CM | POA: Diagnosis not present

## 2013-04-28 DIAGNOSIS — L259 Unspecified contact dermatitis, unspecified cause: Secondary | ICD-10-CM | POA: Diagnosis not present

## 2013-04-28 DIAGNOSIS — L738 Other specified follicular disorders: Secondary | ICD-10-CM | POA: Diagnosis not present

## 2013-04-29 ENCOUNTER — Encounter (INDEPENDENT_AMBULATORY_CARE_PROVIDER_SITE_OTHER): Payer: Medicare Other | Admitting: Ophthalmology

## 2013-04-29 ENCOUNTER — Encounter (INDEPENDENT_AMBULATORY_CARE_PROVIDER_SITE_OTHER): Payer: Self-pay | Admitting: Ophthalmology

## 2013-04-29 DIAGNOSIS — H353290 Exudative age-related macular degeneration, unspecified eye, stage unspecified: Secondary | ICD-10-CM

## 2013-04-29 DIAGNOSIS — I10 Essential (primary) hypertension: Secondary | ICD-10-CM

## 2013-04-29 DIAGNOSIS — H35039 Hypertensive retinopathy, unspecified eye: Secondary | ICD-10-CM

## 2013-04-29 DIAGNOSIS — H3530 Unspecified macular degeneration: Secondary | ICD-10-CM

## 2013-04-29 DIAGNOSIS — H43819 Vitreous degeneration, unspecified eye: Secondary | ICD-10-CM

## 2013-05-25 ENCOUNTER — Encounter (INDEPENDENT_AMBULATORY_CARE_PROVIDER_SITE_OTHER): Payer: Medicare Other | Admitting: Ophthalmology

## 2013-05-26 DIAGNOSIS — G4700 Insomnia, unspecified: Secondary | ICD-10-CM | POA: Diagnosis not present

## 2013-05-26 DIAGNOSIS — R29818 Other symptoms and signs involving the nervous system: Secondary | ICD-10-CM | POA: Diagnosis not present

## 2013-05-26 DIAGNOSIS — F411 Generalized anxiety disorder: Secondary | ICD-10-CM | POA: Diagnosis not present

## 2013-06-02 ENCOUNTER — Encounter (INDEPENDENT_AMBULATORY_CARE_PROVIDER_SITE_OTHER): Payer: Medicare Other | Admitting: Ophthalmology

## 2013-06-08 ENCOUNTER — Encounter (INDEPENDENT_AMBULATORY_CARE_PROVIDER_SITE_OTHER): Payer: Medicare Other | Admitting: Ophthalmology

## 2013-06-15 ENCOUNTER — Encounter (INDEPENDENT_AMBULATORY_CARE_PROVIDER_SITE_OTHER): Payer: Medicare Other | Admitting: Ophthalmology

## 2013-06-15 DIAGNOSIS — H35039 Hypertensive retinopathy, unspecified eye: Secondary | ICD-10-CM

## 2013-06-15 DIAGNOSIS — H3530 Unspecified macular degeneration: Secondary | ICD-10-CM | POA: Diagnosis not present

## 2013-06-15 DIAGNOSIS — I10 Essential (primary) hypertension: Secondary | ICD-10-CM | POA: Diagnosis not present

## 2013-06-15 DIAGNOSIS — H43819 Vitreous degeneration, unspecified eye: Secondary | ICD-10-CM

## 2013-06-15 DIAGNOSIS — H353290 Exudative age-related macular degeneration, unspecified eye, stage unspecified: Secondary | ICD-10-CM | POA: Diagnosis not present

## 2013-07-07 DIAGNOSIS — G4700 Insomnia, unspecified: Secondary | ICD-10-CM | POA: Diagnosis not present

## 2013-07-07 DIAGNOSIS — F411 Generalized anxiety disorder: Secondary | ICD-10-CM | POA: Diagnosis not present

## 2013-07-13 ENCOUNTER — Encounter (INDEPENDENT_AMBULATORY_CARE_PROVIDER_SITE_OTHER): Payer: Medicare Other | Admitting: Ophthalmology

## 2013-07-13 DIAGNOSIS — H43819 Vitreous degeneration, unspecified eye: Secondary | ICD-10-CM

## 2013-07-13 DIAGNOSIS — H353290 Exudative age-related macular degeneration, unspecified eye, stage unspecified: Secondary | ICD-10-CM

## 2013-07-13 DIAGNOSIS — I10 Essential (primary) hypertension: Secondary | ICD-10-CM

## 2013-07-13 DIAGNOSIS — H35039 Hypertensive retinopathy, unspecified eye: Secondary | ICD-10-CM

## 2013-07-13 DIAGNOSIS — H3530 Unspecified macular degeneration: Secondary | ICD-10-CM | POA: Diagnosis not present

## 2013-08-02 DIAGNOSIS — G894 Chronic pain syndrome: Secondary | ICD-10-CM | POA: Diagnosis not present

## 2013-08-02 DIAGNOSIS — M5137 Other intervertebral disc degeneration, lumbosacral region: Secondary | ICD-10-CM | POA: Diagnosis not present

## 2013-08-02 DIAGNOSIS — IMO0001 Reserved for inherently not codable concepts without codable children: Secondary | ICD-10-CM | POA: Diagnosis not present

## 2013-08-02 DIAGNOSIS — M4120 Other idiopathic scoliosis, site unspecified: Secondary | ICD-10-CM | POA: Diagnosis not present

## 2013-08-17 ENCOUNTER — Encounter (INDEPENDENT_AMBULATORY_CARE_PROVIDER_SITE_OTHER): Payer: Medicare Other | Admitting: Ophthalmology

## 2013-08-17 DIAGNOSIS — H43819 Vitreous degeneration, unspecified eye: Secondary | ICD-10-CM

## 2013-08-17 DIAGNOSIS — H353290 Exudative age-related macular degeneration, unspecified eye, stage unspecified: Secondary | ICD-10-CM

## 2013-08-17 DIAGNOSIS — I10 Essential (primary) hypertension: Secondary | ICD-10-CM

## 2013-08-17 DIAGNOSIS — H35039 Hypertensive retinopathy, unspecified eye: Secondary | ICD-10-CM

## 2013-08-31 DIAGNOSIS — F411 Generalized anxiety disorder: Secondary | ICD-10-CM | POA: Diagnosis not present

## 2013-08-31 DIAGNOSIS — L259 Unspecified contact dermatitis, unspecified cause: Secondary | ICD-10-CM | POA: Diagnosis not present

## 2013-09-15 DIAGNOSIS — R32 Unspecified urinary incontinence: Secondary | ICD-10-CM | POA: Diagnosis not present

## 2013-09-15 DIAGNOSIS — I70209 Unspecified atherosclerosis of native arteries of extremities, unspecified extremity: Secondary | ICD-10-CM | POA: Diagnosis not present

## 2013-09-15 DIAGNOSIS — Z79899 Other long term (current) drug therapy: Secondary | ICD-10-CM | POA: Diagnosis not present

## 2013-09-15 DIAGNOSIS — M4120 Other idiopathic scoliosis, site unspecified: Secondary | ICD-10-CM | POA: Diagnosis not present

## 2013-09-15 DIAGNOSIS — M810 Age-related osteoporosis without current pathological fracture: Secondary | ICD-10-CM | POA: Diagnosis not present

## 2013-09-15 DIAGNOSIS — Z0000 Encounter for general adult medical examination without abnormal findings: Secondary | ICD-10-CM | POA: Diagnosis not present

## 2013-09-22 ENCOUNTER — Encounter (INDEPENDENT_AMBULATORY_CARE_PROVIDER_SITE_OTHER): Payer: 59 | Admitting: Ophthalmology

## 2013-09-26 ENCOUNTER — Encounter (INDEPENDENT_AMBULATORY_CARE_PROVIDER_SITE_OTHER): Payer: 59 | Admitting: Ophthalmology

## 2013-10-05 ENCOUNTER — Encounter (INDEPENDENT_AMBULATORY_CARE_PROVIDER_SITE_OTHER): Payer: Medicare Other | Admitting: Ophthalmology

## 2013-10-05 DIAGNOSIS — H353290 Exudative age-related macular degeneration, unspecified eye, stage unspecified: Secondary | ICD-10-CM

## 2013-10-05 DIAGNOSIS — H35039 Hypertensive retinopathy, unspecified eye: Secondary | ICD-10-CM

## 2013-10-05 DIAGNOSIS — H43819 Vitreous degeneration, unspecified eye: Secondary | ICD-10-CM

## 2013-10-05 DIAGNOSIS — I10 Essential (primary) hypertension: Secondary | ICD-10-CM

## 2013-10-06 ENCOUNTER — Institutional Professional Consult (permissible substitution): Payer: 59 | Admitting: Neurology

## 2013-10-10 ENCOUNTER — Institutional Professional Consult (permissible substitution): Payer: 59 | Admitting: Neurology

## 2013-10-20 ENCOUNTER — Encounter (INDEPENDENT_AMBULATORY_CARE_PROVIDER_SITE_OTHER): Payer: Self-pay

## 2013-10-20 ENCOUNTER — Encounter: Payer: Self-pay | Admitting: Neurology

## 2013-10-20 ENCOUNTER — Ambulatory Visit (INDEPENDENT_AMBULATORY_CARE_PROVIDER_SITE_OTHER): Payer: Medicare Other | Admitting: Neurology

## 2013-10-20 VITALS — BP 145/79 | HR 80 | Temp 98.7°F | Ht 63.0 in | Wt 120.0 lb

## 2013-10-20 DIAGNOSIS — R413 Other amnesia: Secondary | ICD-10-CM | POA: Diagnosis not present

## 2013-10-20 NOTE — Patient Instructions (Addendum)
You have complaints of memory loss: memory loss or changes in cognitive function can have many reasons and does not always mean you have dementia. Conditions that can contribute to subjective or objective memory loss include: depression, stress, poor sleep from insomnia or sleep apnea, dehydration, fluctuation in blood sugar values, thyroid or electrolyte dysfunction. Dementia can be caused by stroke, brain atherosclerosis and by Alzheimer's disease or other, more rare and sometimes hereditary causes. We will do some additional testing: blood work, and a brain CT. I will ask Dr. Shelia Media, if you can go up to 10 mg with your donepezil.

## 2013-10-20 NOTE — Progress Notes (Signed)
Subjective:    Patient ID: Fraidy L Nemitz is a 80 y.o. female.  HPI    Star Age, MD, PhD [facility] [address] [place], [place] [address]  Dear Dr. Shelia Media,   I saw your patient, Pelagia Yutzy, upon your kind request in my neurologic clinic today for initial consultation of her memory loss. The patient is accompanied by her niece today. As you know, Ms. Hartwig is a very pleasant 80 year old right-handed woman with an underlying complex medical history of osteoarthritis, reflux disease, recurrent headaches, history of TIA, vitamin B12 deficiency, diverticulosis, restless leg syndrome, chronic constipation, scoliosis, osteoporosis, anxiety, who has had memory loss for the past year. She lives alone in a one story home. She recently had blood work on 09/15/2013 which I reviewed: CBC was unremarkable, BMP showed a BUN of 19, total bili was borderline, total cholesterol was borderline at 203, LDL was 117, urinalysis was negative. You recently started her on generic Aricept 5 mg in 5/15 and she has been on it since then and takes 5 mg. She drives only locally, not at night, [place].    She had head CT without contrast on 05/13/2011 after a fall: No acute intracranial abnormality. 2. Atrophy and chronic microvascular white matter ischemic changes.  She is the youngest of a total of 13 kids, has no living siblings, no children, is widowed. Her niece is the closest relative and lives in [place].   She gets Meals on Wheels. There is no issue with hallucinations, no delusions. She would be agreeable to have an aid during the day.    Her Past Medical History Is Significant For: Past Medical History  Diagnosis Date  . Gilbert disease   . Atrophic vaginitis   . Vascular headache   . Mini stroke   . Stroke 4/00  . Shingles 11/02  . Diverticulosis   . Vulvitis 2004  . Osteoporosis   . Hyperlipidemia     Her Past Surgical  History Is Significant For: Past Surgical History  Procedure Laterality Date  . Cataract extraction  04/2003    bilateral    Her Family History Is Significant For: Family History  Problem Relation Age of Onset  . Hypertension Mother   . Hypertension Father   . Heart disease Father   . Breast cancer Sister   . Alzheimer's disease Brother     Her Social History Is Significant For: History   Social History  . Marital Status: Widowed    Spouse Name: N/A    Number of Children: N/A  . Years of Education: N/A   Social History Main Topics  . Smoking status: Never Smoker   . Smokeless tobacco: None  . Alcohol Use: No  . Drug Use: No  . Sexual Activity: None   Other Topics Concern  . None   Social History Narrative  . None    Her Allergies Are:  Allergies  Allergen Reactions  . Prolia [Denosumab]   . Penicillins Rash  :   Her Current Medications Are:  Outpatient Encounter Prescriptions as of 10/20/2013  Medication Sig  . aspirin 81 MG tablet Take 81 mg by mouth daily.  Marland Kitchen CALCIUM PO Take by mouth daily.  Marland Kitchen donepezil (ARICEPT) 5 MG tablet Take 5 mg by mouth at bedtime as needed.  Marland Kitchen ketorolac (ACULAR) 0.5 % ophthalmic solution Place 2 drops into both eyes daily.  . Mirtazapine (REMERON PO) Take by mouth as needed.  Marland Kitchen  Multiple Vitamins-Minerals (MULTIVITAMIN PO) Take by mouth daily.  . Omega-3 Fatty Acids (FISH OIL PO) Take by mouth daily.  Marland Kitchen ROPINIROLE HCL PO Take by mouth daily.  . Rosuvastatin Calcium (CRESTOR PO) Take by mouth daily.  . TraMADol HCl 50 MG TBDP Take by mouth 3 (three) times daily.  Marland Kitchen triamcinolone (KENALOG) 0.025 % cream Apply 1 application topically daily.  Marland Kitchen UNABLE TO FIND every 30 (thirty) days. Med Name: b12 injection  :  Review of Systems:  Out of a complete 14 point review of systems, all are reviewed and negative with the exception of these symptoms as listed below:  Review of Systems  Constitutional: Positive for chills, fatigue and  unexpected weight change (gain).  HENT: Positive for trouble swallowing.   Eyes: Positive for visual disturbance (blurred vision, diplopia).  Respiratory: Negative.   Cardiovascular: Positive for leg swelling.  Gastrointestinal: Negative.   Endocrine: Positive for cold intolerance and heat intolerance.  Genitourinary: Positive for enuresis.  Musculoskeletal: Positive for arthralgias, back pain, joint swelling and myalgias.  Skin: Positive for rash.  Allergic/Immunologic: Negative.   Neurological: Positive for dizziness and weakness.       Memory loss  Hematological: Bruises/bleeds easily.  Psychiatric/Behavioral: Positive for sleep disturbance (eds), dysphoric mood and agitation. The patient is nervous/anxious and is hyperactive.     Objective:  Neurologic Exam  Physical Exam Physical Examination:   Filed Vitals:   10/20/13 1406  BP: 145/79  Pulse: 80  Temp: 98.7 F (37.1 C)    General Examination: The patient is a very pleasant 80 y.o. female in no acute distress. She is calm and cooperative with the exam. She denies Auditory Hallucinations and Visual Hallucinations. She is well groomed and situated in a chair.   HEENT: Normocephalic, atraumatic, pupils are equal, round and reactive to light and accommodation. Funduscopic exam is normal with sharp disc margins noted. Extraocular tracking shows no saccadic breakdown without nystagmus noted. Hearing is intact. Tympanic membranes are clear bilaterally. Face is symmetric with no facial masking and normal facial sensation. There is no lip, neck or jaw tremor. Neck is mildly rigid with intact passive ROM. There are no carotid bruits on auscultation. Oropharynx exam reveals mild mouth dryness. No significant airway crowding is noted. Mallampati is class II. Tongue protrudes centrally and palate elevates symmetrically.    Chest: is clear to auscultation without wheezing, rhonchi or crackles noted.  Heart: sounds are regular and normal  without murmurs, rubs or gallops noted.   Abdomen: is soft, non-tender and non-distended with normal bowel sounds appreciated on auscultation.  Extremities: There is no pitting edema in the distal lower extremities bilaterally. Pedal pulses are intact.   Skin: is warm and dry with no trophic changes noted. Age-related changes are noted on the skin. Multiple small bruises of varying ages are noted on the forearms. She has a rash in the distal LEs, L more than R.   Musculoskeletal: exam reveals no obvious joint deformities, tenderness or joint swelling or erythema. Changes consistent with OA of the hands are noted bilaterally.   Neurologically:  Mental status: The patient is awake and alert, paying good  attention. She is able to partially provide the history. Her niece provides details. She is oriented to: person, place, time/date, situation, day of week and month of year. Her memory, attention, language and knowledge are impaired. There is no aphasia, agnosia, apraxia or anomia. There is a no significant degree of bradyphrenia. Speech is not hypophonic with no dysarthria noted.  Speech is rapid and a little pressured and she needs multiple redirections. Mood is congruent and affect is normal.  Her MMSE (Mini-Mental state exam) score is 22/30.  CDT (Clock Drawing Test) score is 4/4.  AFT (Animal Fluency Test) score is 18.   Cranial nerves are as described above under HEENT exam. In addition, shoulder shrug is normal with equal shoulder height noted.  Motor exam: Normal bulk, and strength for age is noted. Tone is not rigid with absence of cogwheeling in the extremities. There is overall no bradykinesia. There is no drift or rebound. There is no tremor.  Romberg is positive. Reflexes are 1+ in the upper extremities and 1+ in the lower extremities. Toes are downgoing bilaterally. Fine motor skills: Finger taps, hand movements, and rapid alternating patting are mildly impaired bilaterally. Foot taps  and foot agility are mildly impaired bilaterally.   Cerebellar testing shows no dysmetria or intention tremor on finger to nose testing. Heel to shin is unremarkable. There is no truncal or gait ataxia.   Sensory exam is intact to light touch, pinprick, vibration, temperature sense and proprioception in the upper and lower extremities.   Gait, station and balance: She stands up from the seated position with mild difficulty and needs to push herself up. No veering to one side is noted. No leaning to one side. Posture is moderately stooped and there is significant kyphoscoliosis. Stance is wide-based. She turns in 3 steps. Tandem walk is not possible. Balance is mildly impaired.     Assessment and Plan:   In summary, Deaundra L Parenteau is a very pleasant 80 y.o.-year old female with an underlying complex medical history of osteoarthritis, reflux disease, recurrent headaches, history of TIA, vitamin B12 deficiency, diverticulosis, restless leg syndrome, chronic constipation, scoliosis, osteoporosis, anxiety, who has had memory loss for the past year. Her history and exam are in keeping with mild dementia, possible AD. She has been on Aricept. You can increase this to 10 mg.   I had a long chat with the patient and her niece about my findings and the diagnosis of memory loss and dementia, its prognosis and treatment options. Implications of diagnosis explained at length with the patient and caregiver.. We talked about medical treatments and non-pharmacological approaches. We talked about maintaining a healthy lifestyle in general and staying active mentally and physically. I encouraged the patient to eat healthy, exercise daily and keep well hydrated, to keep a scheduled bedtime and wake time routine, to not skip any meals and eat healthy snacks in between meals and to have protein with every meal. I stressed the importance of regular exercise, within of course the patient's own mobility limitations. I  encouraged the patient to keep up with current events by reading the news paper or watching the news and to do word puzzles, or if feasible, to go on [email or web address].   As far as further diagnostic testing is concerned, I suggested the following: CT head for comparison from 2013.  As far as medications are concerned, I recommended the following at this time: no change as yet. You can increase Aricept to 10 mg. She and her niece are advised to look into retirement communities for her so she can be in a more conducive environment, such as Assisted Living, maybe closer to where Darvin Neighbours, her niece lives.   I answered all them questions today and the patient and her niece were in agreement with the above outlined plan. I would like to see the patient back  in 4 months, sooner if the need arises and encouraged them to call with any interim questions, concerns, problems.   Thank you very much for allowing me to participate in the care of this nice patient. If I can be of any further assistance to you please do not hesitate to call me at [phone].  Sincerely,   Star Age, MD, PhD

## 2013-10-25 ENCOUNTER — Other Ambulatory Visit (INDEPENDENT_AMBULATORY_CARE_PROVIDER_SITE_OTHER): Payer: Self-pay

## 2013-10-25 DIAGNOSIS — I8310 Varicose veins of unspecified lower extremity with inflammation: Secondary | ICD-10-CM | POA: Diagnosis not present

## 2013-10-25 DIAGNOSIS — R413 Other amnesia: Secondary | ICD-10-CM | POA: Diagnosis not present

## 2013-10-25 DIAGNOSIS — Z0289 Encounter for other administrative examinations: Secondary | ICD-10-CM

## 2013-10-26 LAB — RPR: SYPHILIS RPR SCR: NONREACTIVE

## 2013-10-26 LAB — TSH: TSH: 1.97 u[IU]/mL (ref 0.450–4.500)

## 2013-10-26 LAB — ANA W/REFLEX: Anti Nuclear Antibody(ANA): NEGATIVE

## 2013-10-26 LAB — HGB A1C W/O EAG: Hgb A1c MFr Bld: 5.9 % — ABNORMAL HIGH (ref 4.8–5.6)

## 2013-11-03 NOTE — Progress Notes (Signed)
Quick Note:  Please call pt or her niece: her blood work is normal with the exception of a borderline HBA1c, which is a diabetes marker and it indicates risk for diabetes, but not frank diabetes.  Star Age, MD, PhD [facility]  ______

## 2013-11-04 NOTE — Progress Notes (Signed)
Quick Note:  I called pt and relayed lab results. She will tell niece and if she has questions will call back. ______

## 2013-11-23 DIAGNOSIS — L259 Unspecified contact dermatitis, unspecified cause: Secondary | ICD-10-CM | POA: Diagnosis not present

## 2013-11-24 DIAGNOSIS — I8310 Varicose veins of unspecified lower extremity with inflammation: Secondary | ICD-10-CM | POA: Diagnosis not present

## 2013-11-30 ENCOUNTER — Encounter (INDEPENDENT_AMBULATORY_CARE_PROVIDER_SITE_OTHER): Payer: Medicare Other | Admitting: Ophthalmology

## 2013-11-30 DIAGNOSIS — I10 Essential (primary) hypertension: Secondary | ICD-10-CM

## 2013-11-30 DIAGNOSIS — H35039 Hypertensive retinopathy, unspecified eye: Secondary | ICD-10-CM

## 2013-11-30 DIAGNOSIS — H43819 Vitreous degeneration, unspecified eye: Secondary | ICD-10-CM

## 2013-11-30 DIAGNOSIS — H353290 Exudative age-related macular degeneration, unspecified eye, stage unspecified: Secondary | ICD-10-CM

## 2013-12-05 DIAGNOSIS — D0460 Carcinoma in situ of skin of unspecified upper limb, including shoulder: Secondary | ICD-10-CM | POA: Diagnosis not present

## 2013-12-05 DIAGNOSIS — D099 Carcinoma in situ, unspecified: Secondary | ICD-10-CM

## 2013-12-05 DIAGNOSIS — I8310 Varicose veins of unspecified lower extremity with inflammation: Secondary | ICD-10-CM | POA: Diagnosis not present

## 2013-12-05 DIAGNOSIS — D485 Neoplasm of uncertain behavior of skin: Secondary | ICD-10-CM | POA: Diagnosis not present

## 2013-12-05 HISTORY — DX: Carcinoma in situ, unspecified: D09.9

## 2014-01-30 DIAGNOSIS — D0460 Carcinoma in situ of skin of unspecified upper limb, including shoulder: Secondary | ICD-10-CM | POA: Diagnosis not present

## 2014-01-31 DIAGNOSIS — W19XXXD Unspecified fall, subsequent encounter: Secondary | ICD-10-CM | POA: Diagnosis not present

## 2014-01-31 DIAGNOSIS — R2681 Unsteadiness on feet: Secondary | ICD-10-CM | POA: Diagnosis not present

## 2014-01-31 DIAGNOSIS — S299XXD Unspecified injury of thorax, subsequent encounter: Secondary | ICD-10-CM | POA: Diagnosis not present

## 2014-01-31 DIAGNOSIS — R0781 Pleurodynia: Secondary | ICD-10-CM | POA: Diagnosis not present

## 2014-02-08 ENCOUNTER — Encounter (INDEPENDENT_AMBULATORY_CARE_PROVIDER_SITE_OTHER): Payer: Medicare Other | Admitting: Ophthalmology

## 2014-02-08 DIAGNOSIS — H35033 Hypertensive retinopathy, bilateral: Secondary | ICD-10-CM

## 2014-02-08 DIAGNOSIS — H43813 Vitreous degeneration, bilateral: Secondary | ICD-10-CM | POA: Diagnosis not present

## 2014-02-08 DIAGNOSIS — H3532 Exudative age-related macular degeneration: Secondary | ICD-10-CM | POA: Diagnosis not present

## 2014-02-08 DIAGNOSIS — I10 Essential (primary) hypertension: Secondary | ICD-10-CM

## 2014-02-27 ENCOUNTER — Encounter (HOSPITAL_COMMUNITY): Payer: Self-pay

## 2014-02-27 ENCOUNTER — Emergency Department (HOSPITAL_COMMUNITY)
Admission: EM | Admit: 2014-02-27 | Discharge: 2014-02-27 | Disposition: A | Discharge status: Home / Self Care | Payer: Medicare Other | Attending: Emergency Medicine | Admitting: Emergency Medicine

## 2014-02-27 DIAGNOSIS — Z8619 Personal history of other infectious and parasitic diseases: Secondary | ICD-10-CM | POA: Diagnosis not present

## 2014-02-27 DIAGNOSIS — Z8739 Personal history of other diseases of the musculoskeletal system and connective tissue: Secondary | ICD-10-CM | POA: Insufficient documentation

## 2014-02-27 DIAGNOSIS — Z7982 Long term (current) use of aspirin: Secondary | ICD-10-CM | POA: Insufficient documentation

## 2014-02-27 DIAGNOSIS — E785 Hyperlipidemia, unspecified: Secondary | ICD-10-CM | POA: Diagnosis not present

## 2014-02-27 DIAGNOSIS — K143 Hypertrophy of tongue papillae: Secondary | ICD-10-CM | POA: Diagnosis not present

## 2014-02-27 DIAGNOSIS — Z8673 Personal history of transient ischemic attack (TIA), and cerebral infarction without residual deficits: Secondary | ICD-10-CM | POA: Insufficient documentation

## 2014-02-27 DIAGNOSIS — Z880 Allergy status to penicillin: Secondary | ICD-10-CM | POA: Diagnosis not present

## 2014-02-27 DIAGNOSIS — K148 Other diseases of tongue: Secondary | ICD-10-CM | POA: Insufficient documentation

## 2014-02-27 DIAGNOSIS — Z7952 Long term (current) use of systemic steroids: Secondary | ICD-10-CM | POA: Insufficient documentation

## 2014-02-27 DIAGNOSIS — Z8742 Personal history of other diseases of the female genital tract: Secondary | ICD-10-CM | POA: Diagnosis not present

## 2014-02-27 DIAGNOSIS — Z79899 Other long term (current) drug therapy: Secondary | ICD-10-CM | POA: Diagnosis not present

## 2014-02-27 NOTE — Discharge Instructions (Signed)
Drink plenty of water.  Stop using mouthwashes.  Brush your tongue with a toothbrush and scrape the tongue.

## 2014-02-27 NOTE — ED Notes (Signed)
Pt here for black on her tongue. Denies any pepto bismul use or iron use. Pt states she noticed it this morning. Also reports feeling like she is having some trouble swallowing since last night. Feels like she has to push hard to swallow.

## 2014-02-27 NOTE — ED Notes (Signed)
MD at the bedside  

## 2014-02-27 NOTE — ED Provider Notes (Signed)
CSN: [id number]     Arrival date & time 02/27/14  1143 History   First MD Initiated Contact with Patient 02/27/14 1518     Chief Complaint  Patient presents with  . tongue is black      (Consider location/radiation/quality/duration/timing/severity/associated sxs/prior Treatment) HPI Comments: Patient presents today with a chief complaint of a black tongue.  She reports that she first noticed it a couple of days ago and it has been getting increasingly more black.  She also reports that her mouth feels dry.  She denies difficulty swallowing.  She apparently told the triage RN that she was having difficulty swallowing, but when asked about this she states that her mouth feels dry.  She denies nausea, vomiting, fever, chills, or abdominal pain.  She denies eating any foods out of the ordinary.  She denies any new medications.  She is currently not on antibiotics.  She has not used Iron or Pepto Bismol recently.  She does not smoke.    The history is provided by the patient.    Past Medical History  Diagnosis Date  . Gilbert disease   . Atrophic vaginitis   . Vascular headache   . Mini stroke   . Stroke 4/00  . Shingles 11/02  . Diverticulosis   . Vulvitis 2004  . Osteoporosis   . Hyperlipidemia    Past Surgical History  Procedure Laterality Date  . Cataract extraction  04/2003    bilateral   Family History  Problem Relation Age of Onset  . Hypertension Mother   . Hypertension Father   . Heart disease Father   . Breast cancer Sister   . Alzheimer's disease Brother    History  Substance Use Topics  . Smoking status: Never Smoker   . Smokeless tobacco: Not on file  . Alcohol Use: No   OB History    Gravida Para Term Preterm AB TAB SAB Ectopic Multiple Living   0 0 0 0 0 0 0 0 0 0      Review of Systems  All other systems reviewed and are negative.     Allergies  Prolia and Penicillins  Home Medications   Prior to Admission medications   Medication Sig Start  Date End Date Taking? Authorizing Provider  aspirin 81 MG tablet Take 81 mg by mouth daily.   Yes Historical Provider, MD  CALCIUM PO Take 1 tablet by mouth daily.    Yes Historical Provider, MD  clobetasol cream (TEMOVATE) 3.79 % Apply 1 application topically daily as needed (for skin).  11/24/13  Yes Historical Provider, MD  cyanocobalamin (,VITAMIN B-12,) 1000 MCG/ML injection Inject 1,000 mcg into the muscle every 30 (thirty) days.   Yes Historical Provider, MD  donepezil (ARICEPT) 5 MG tablet Take 5 mg by mouth at bedtime as needed (for memory).    Yes Historical Provider, MD  ketorolac (ACULAR) 0.5 % ophthalmic solution Place 2 drops into both eyes daily. 10/05/13  Yes Historical Provider, MD  mirtazapine (REMERON) 15 MG tablet Take 15 mg by mouth at bedtime as needed (for mood).   Yes Historical Provider, MD  Multiple Vitamins-Minerals (MULTIVITAMIN PO) Take 1 tablet by mouth daily.    Yes Historical Provider, MD  mupirocin ointment (BACTROBAN) 2 % Apply 1 application topically daily as needed (to skin).  02/13/14  Yes Historical Provider, MD  Omega-3 Fatty Acids (FISH OIL PO) Take 1 tablet by mouth daily.    Yes Historical Provider, MD  rOPINIRole (REQUIP) 0.5 MG tablet  Take 0.5 mg by mouth daily.   Yes Historical Provider, MD  Rosuvastatin Calcium (CRESTOR PO) Take 1 tablet by mouth daily.    Yes Historical Provider, MD  sertraline (ZOLOFT) 50 MG tablet Take 50 mg by mouth daily.  12/21/13  Yes Historical Provider, MD  TraMADol HCl 50 MG TBDP Take by mouth 3 (three) times daily.   Yes Historical Provider, MD  triamcinolone cream (KENALOG) 0.1 % Apply 1 application topically daily as needed (to skin).  12/06/13  Yes Historical Provider, MD   BP 118/71 mmHg  Pulse 72  Temp(Src) 97.3 F (36.3 C) (Oral)  Resp 18  Ht 5\' 5"  (1.651 m)  Wt 128 lb (58.06 kg)  BMI 21.30 kg/m2  SpO2 98% Physical Exam  Constitutional: She appears well-developed and well-nourished.  HENT:  Head: Normocephalic and  atraumatic.  Mouth/Throat: Oropharynx is clear and moist.  Black color to the top of the tongue.  Unable to scrape it off.  Neck: Normal range of motion. Neck supple.  Cardiovascular: Normal rate, regular rhythm and normal heart sounds.   Pulmonary/Chest: Effort normal and breath sounds normal.  Musculoskeletal: Normal range of motion.  Neurological: She is alert.  Skin: Skin is warm and dry.  Psychiatric: She has a normal mood and affect.  Nursing note and vitals reviewed.   ED Course  Procedures (including critical care time) Labs Review Labs Reviewed - No data to display  Imaging Review No results found.   EKG Interpretation None      MDM   Final diagnoses:  None   Patient presents today with a chief complaint of black tongue.  No other symptoms.  On exam, she is non toxic appearing.  She denies any recent new medications.  She does use Mouthwash everyday.  Physical exam most consistent with Black Hairy Tongue.  Patient instructed to have good oral hygeine, stop taking her mouthwash, and scrape the tongue.  Patient stable for discharge.  Return precautions given.  Patient also evaluated by Dr. Maryan Rued who is in agreement with the plan.    Hyman Bible, PA-C 02/27/14 2324  Blanchie Dessert, MD 02/28/14 [phone]

## 2014-03-02 DIAGNOSIS — K143 Hypertrophy of tongue papillae: Secondary | ICD-10-CM | POA: Diagnosis not present

## 2014-03-02 DIAGNOSIS — C44611 Basal cell carcinoma of skin of unspecified upper limb, including shoulder: Secondary | ICD-10-CM | POA: Diagnosis not present

## 2014-03-02 DIAGNOSIS — I10 Essential (primary) hypertension: Secondary | ICD-10-CM | POA: Diagnosis not present

## 2014-03-02 DIAGNOSIS — F5104 Psychophysiologic insomnia: Secondary | ICD-10-CM | POA: Diagnosis not present

## 2014-03-07 DIAGNOSIS — F418 Other specified anxiety disorders: Secondary | ICD-10-CM | POA: Diagnosis not present

## 2014-03-07 DIAGNOSIS — F5104 Psychophysiologic insomnia: Secondary | ICD-10-CM | POA: Diagnosis not present

## 2014-03-28 ENCOUNTER — Telehealth: Payer: Self-pay | Admitting: Neurology

## 2014-03-28 ENCOUNTER — Ambulatory Visit: Payer: Medicare Other | Admitting: Neurology

## 2014-03-28 NOTE — Telephone Encounter (Signed)
Patient is a no show for today's appointment 03/28/14 @ 11:15

## 2014-03-30 ENCOUNTER — Encounter: Payer: Self-pay | Admitting: Neurology

## 2014-05-02 DIAGNOSIS — R413 Other amnesia: Secondary | ICD-10-CM | POA: Diagnosis not present

## 2014-05-03 ENCOUNTER — Encounter (INDEPENDENT_AMBULATORY_CARE_PROVIDER_SITE_OTHER): Payer: Medicare Other | Admitting: Ophthalmology

## 2014-05-03 DIAGNOSIS — H43813 Vitreous degeneration, bilateral: Secondary | ICD-10-CM | POA: Diagnosis not present

## 2014-05-03 DIAGNOSIS — H3532 Exudative age-related macular degeneration: Secondary | ICD-10-CM

## 2014-05-03 DIAGNOSIS — I10 Essential (primary) hypertension: Secondary | ICD-10-CM

## 2014-05-03 DIAGNOSIS — H35033 Hypertensive retinopathy, bilateral: Secondary | ICD-10-CM

## 2014-06-13 DIAGNOSIS — F5101 Primary insomnia: Secondary | ICD-10-CM | POA: Diagnosis not present

## 2014-07-12 DIAGNOSIS — F5104 Psychophysiologic insomnia: Secondary | ICD-10-CM | POA: Diagnosis not present

## 2014-07-12 DIAGNOSIS — K143 Hypertrophy of tongue papillae: Secondary | ICD-10-CM | POA: Diagnosis not present

## 2014-08-09 ENCOUNTER — Encounter (INDEPENDENT_AMBULATORY_CARE_PROVIDER_SITE_OTHER): Payer: Medicare Other | Admitting: Ophthalmology

## 2014-08-09 DIAGNOSIS — I10 Essential (primary) hypertension: Secondary | ICD-10-CM

## 2014-08-09 DIAGNOSIS — H43813 Vitreous degeneration, bilateral: Secondary | ICD-10-CM | POA: Diagnosis not present

## 2014-08-09 DIAGNOSIS — H35033 Hypertensive retinopathy, bilateral: Secondary | ICD-10-CM | POA: Diagnosis not present

## 2014-08-09 DIAGNOSIS — H3532 Exudative age-related macular degeneration: Secondary | ICD-10-CM

## 2014-09-23 ENCOUNTER — Encounter (HOSPITAL_COMMUNITY): Payer: Self-pay | Admitting: Emergency Medicine

## 2014-09-23 ENCOUNTER — Emergency Department (INDEPENDENT_AMBULATORY_CARE_PROVIDER_SITE_OTHER)
Admission: EM | Admit: 2014-09-23 | Discharge: 2014-09-23 | Disposition: A | Discharge status: Home / Self Care | Payer: Medicare Other | Source: Home / Self Care | Attending: Family Medicine | Admitting: Family Medicine

## 2014-09-23 ENCOUNTER — Emergency Department (INDEPENDENT_AMBULATORY_CARE_PROVIDER_SITE_OTHER): Payer: Medicare Other

## 2014-09-23 DIAGNOSIS — W19XXXA Unspecified fall, initial encounter: Secondary | ICD-10-CM | POA: Diagnosis not present

## 2014-09-23 DIAGNOSIS — S52022A Displaced fracture of olecranon process without intraarticular extension of left ulna, initial encounter for closed fracture: Secondary | ICD-10-CM

## 2014-09-23 MED ORDER — TRAMADOL HCL 50 MG PO TABS: 50.0000 mg | ORAL_TABLET | Freq: Four times a day (QID) | ORAL | Status: DC | PRN

## 2014-09-23 NOTE — ED Provider Notes (Signed)
` [name] is a 81 y.o. female who presents to Urgent Care today for elbow injury. Patient tripped and fell landing on her left elbow today. She denies any loss of consciousness or head injury. She notes swelling at the olecranon. She has pain with elbow motion. No radiating pain weakness or numbness. She feels well otherwise.   Past Medical History  Diagnosis Date  . Gilbert disease   . Atrophic vaginitis   . Vascular headache   . Mini stroke   . Stroke 4/00  . Shingles 11/02  . Diverticulosis   . Vulvitis 2004  . Osteoporosis   . Hyperlipidemia    Past Surgical History  Procedure Laterality Date  . Cataract extraction  04/2003    bilateral   History  Substance Use Topics  . Smoking status: Never Smoker   . Smokeless tobacco: Not on file  . Alcohol Use: No   ROS as above Medications: No current facility-administered medications for this encounter.   Current Outpatient Prescriptions  Medication Sig Dispense Refill  . aspirin 81 MG tablet Take 81 mg by mouth daily.    Marland Kitchen CALCIUM PO Take 1 tablet by mouth daily.     . clobetasol cream (TEMOVATE) 3.97 % Apply 1 application topically daily as needed (for skin).     . cyanocobalamin (,VITAMIN B-12,) 1000 MCG/ML injection Inject 1,000 mcg into the muscle every 30 (thirty) days.    Marland Kitchen donepezil (ARICEPT) 5 MG tablet Take 5 mg by mouth at bedtime as needed (for memory).     Marland Kitchen ketorolac (ACULAR) 0.5 % ophthalmic solution Place 2 drops into both eyes daily.    . mirtazapine (REMERON) 15 MG tablet Take 15 mg by mouth at bedtime as needed (for mood).    . Multiple Vitamins-Minerals (MULTIVITAMIN PO) Take 1 tablet by mouth daily.     . mupirocin ointment (BACTROBAN) 2 % Apply 1 application topically daily as needed (to skin).   0  . Omega-3 Fatty Acids (FISH OIL PO) Take 1 tablet by mouth daily.     Marland Kitchen rOPINIRole (REQUIP) 0.5 MG tablet Take 0.5 mg by mouth daily.    . Rosuvastatin Calcium (CRESTOR PO) Take 1 tablet by mouth daily.      . sertraline (ZOLOFT) 50 MG tablet Take 50 mg by mouth daily.   0  . traMADol (ULTRAM) 50 MG tablet Take 1 tablet (50 mg total) by mouth every 6 (six) hours as needed. 10 tablet 0  . triamcinolone cream (KENALOG) 0.1 % Apply 1 application topically daily as needed (to skin).   0   Allergies  Allergen Reactions  . Prolia [Denosumab]   . Penicillins Rash     Exam:  BP 142/77 mmHg  Pulse 82  Temp(Src) 98.7 F (37.1 C) (Oral)  Resp 14  SpO2 96% Gen: Well NAD HEENT: EOMI,  MMM Lungs: Normal work of breathing. CTABL Heart: RRR no MRG Abd: NABS, Soft. Nondistended, Nontender Exts: Brisk capillary refill, warm and well perfused.  Left arm: Elbow swollen and tender olecranon. Range of motion lacks full extension by 5. Flexion limited to 100. Normal pronation and supination. Pulses and sensation are intact distally  Dressings were applied on the wounds of her elbow and Patient was placed into a well formed posterior arm splint.   X-ray shows a displaced olecranon fracture. She has soft tissue swelling around the site of the fracture. No results found for this or any previous visit (from the past 24 hour(s)). No results  found.  Assessment and Plan: 81 y.o. female with displaced olecranon fracture. Posterior arm splint applied. Patient will follow-up with Dr. Berenice Primas on Monday or Tuesday.  Discussed warning signs or symptoms. Please see discharge instructions. Patient expresses understanding.     Gregor Hams, MD 09/23/14 [phone]

## 2014-09-23 NOTE — ED Notes (Signed)
Reports she fell earlier this afternoon on to left elbow while working on yard Alert, no signs of acute distress.

## 2014-09-23 NOTE — Discharge Instructions (Signed)
Thank you for coming in today. Follow up with orthopedic surgeon on Monday or Tuesday.  Take tramadol as needed for pain.     Cast or Splint Care Casts and splints support injured limbs and keep bones from moving while they heal.  HOME CARE  Keep the cast or splint uncovered during the drying period.  A plaster cast can take 24 to 48 hours to dry.  A fiberglass cast will dry in less than 1 hour.  Do not rest the cast on anything harder than a pillow for 24 hours.  Do not put weight on your injured limb. Do not put pressure on the cast. Wait for your doctor's approval.  Keep the cast or splint dry.  Cover the cast or splint with a plastic bag during baths or wet weather.  If you have a cast over your chest and belly (trunk), take sponge baths until the cast is taken off.  If your cast gets wet, dry it with a towel or blow dryer. Use the cool setting on the blow dryer.  Keep your cast or splint clean. Wash a dirty cast with a damp cloth.  Do not put any objects under your cast or splint.  Do not scratch the skin under the cast with an object. If itching is a problem, use a blow dryer on a cool setting over the itchy area.  Do not trim or cut your cast.  Do not take out the padding from inside your cast.  Exercise your joints near the cast as told by your doctor.  Raise (elevate) your injured limb on 1 or 2 pillows for the first 1 to 3 days. GET HELP IF:  Your cast or splint cracks.  Your cast or splint is too tight or too loose.  You itch badly under the cast.  Your cast gets wet or has a soft spot.  You have a bad smell coming from the cast.  You get an object stuck under the cast.  Your skin around the cast becomes red or sore.  You have new or more pain after the cast is put on. GET HELP RIGHT AWAY IF:  You have fluid leaking through the cast.  You cannot move your fingers or toes.  Your fingers or toes turn blue or white or are cool, painful, or puffy  (swollen).  You have tingling or lose feeling (numbness) around the injured area.  You have bad pain or pressure under the cast.  You have trouble breathing or have shortness of breath.  You have chest pain. Document Released: 08/07/2010 Document Revised: 12/08/2012 Document Reviewed: 10/14/2012 [facility] Patient Information 2015 [place], [place]. This information is not intended to replace advice given to you by your health care provider. Make sure you discuss any questions you have with your health care provider.  Elbow Fracture, Simple A fracture is a break in one of the bones.When fractures are not displaced or separated, they may be treated with only a sling or splint. The sling or splint may only be required for two to three weeks. In these cases, often the elbow is put through early range of motion exercises to prevent the elbow from getting stiff. DIAGNOSIS  The diagnosis (learning what is wrong) of a fractured elbow is made by x-ray. These may be required before and after the elbow is put into a splint or cast. X-rays are taken after to make sure the bone pieces have not moved. HOME CARE INSTRUCTIONS   Only take  over-the-counter or prescription medicines for pain, discomfort, or fever as directed by your caregiver.  If you have a splint held on with an elastic wrap and your hand or fingers become numb or cold and blue, loosen the wrap and reapply more loosely. See your caregiver if there is no relief.  You may use ice for twenty minutes, four times per day, for the first two to three days.  Use your elbow as directed.  See your caregiver as directed. It is very important to keep all follow-up referrals and appointments in order to avoid any long-term problems with your elbow including chronic pain or stiffness. SEEK IMMEDIATE MEDICAL CARE IF:   There is swelling or increasing pain in elbow.  You begin to lose feeling or experience numbness or tingling in your hand or  fingers.  You develop swelling of the hand and fingers.  You get a cold or blue hand or fingers on affected side. MAKE SURE YOU:   Understand these instructions.  Will watch your condition.  Will get help right away if you are not doing well or get worse. Document Released: 04/01/2001 Document Revised: 06/30/2011 Document Reviewed: 02/20/2009 [facility] Patient Information 2015 [place], [place]. This information is not intended to replace advice given to you by your health care provider. Make sure you discuss any questions you have with your health care provider.

## 2014-09-26 ENCOUNTER — Emergency Department (INDEPENDENT_AMBULATORY_CARE_PROVIDER_SITE_OTHER)
Admission: EM | Admit: 2014-09-26 | Discharge: 2014-09-26 | Disposition: A | Discharge status: Home / Self Care | Payer: Medicare Other | Source: Home / Self Care

## 2014-09-26 ENCOUNTER — Encounter (HOSPITAL_COMMUNITY): Payer: Self-pay | Admitting: Emergency Medicine

## 2014-09-26 DIAGNOSIS — T148XXA Other injury of unspecified body region, initial encounter: Secondary | ICD-10-CM

## 2014-09-26 DIAGNOSIS — T148 Other injury of unspecified body region: Secondary | ICD-10-CM

## 2014-09-26 DIAGNOSIS — S42402K Unspecified fracture of lower end of left humerus, subsequent encounter for fracture with nonunion: Secondary | ICD-10-CM | POA: Diagnosis not present

## 2014-09-26 NOTE — ED Notes (Signed)
Follow up on elbow injury.   Pt states has a couple vomiting episodes and is worried.  Denies any other symptoms.

## 2014-09-26 NOTE — Discharge Instructions (Signed)
Please call Dr. Berenice Primas tomorrow morning for a follow-up appointment for evaluation of fixing your elbow. His office is located at [address], [place], [place] [address]. His phone number is [phone]

## 2014-09-26 NOTE — ED Provider Notes (Signed)
CSN: [id number]     Arrival date & time 09/26/14  2841 History   None    Chief Complaint  Patient presents with  . Follow-up   (Consider location/radiation/quality/duration/timing/severity/associated sxs/prior Treatment) HPI   Patient presenting for follow-up care for her left elbow fracture. Patient states she is here today because she was unsure what to do with regards to her follow-up and further treatment of her elbow. She has kept her brace in place since the injury and being seen in the urgent care. 2 ports some pain and stiffness and now some new forming abrasions on the top of her hand from the brace. Denies any change in sensation in her hands but does now have increased bruising throughout her hand. Denies fevers, increasing pain, chest pain, shortness of breath, palpitations, further falls. States that she is taking some leftover pain medications for the pain and this is adequate.   Past Medical History  Diagnosis Date  . Gilbert disease   . Atrophic vaginitis   . Vascular headache   . Mini stroke   . Stroke 4/00  . Shingles 11/02  . Diverticulosis   . Vulvitis 2004  . Osteoporosis   . Hyperlipidemia    Past Surgical History  Procedure Laterality Date  . Cataract extraction  04/2003    bilateral   Family History  Problem Relation Age of Onset  . Hypertension Mother   . Hypertension Father   . Heart disease Father   . Breast cancer Sister   . Alzheimer's disease Brother    History  Substance Use Topics  . Smoking status: Never Smoker   . Smokeless tobacco: Not on file  . Alcohol Use: No   OB History    Gravida Para Term Preterm AB TAB SAB Ectopic Multiple Living   0 0 0 0 0 0 0 0 0 0      Review of Systems Per HPI with all other pertinent systems negative.   Allergies  Prolia and Penicillins  Home Medications   Prior to Admission medications   Medication Sig Start Date End Date Taking? Authorizing Provider  aspirin 81 MG tablet Take 81 mg by mouth  daily.    Historical Provider, MD  CALCIUM PO Take 1 tablet by mouth daily.     Historical Provider, MD  clobetasol cream (TEMOVATE) 3.24 % Apply 1 application topically daily as needed (for skin).  11/24/13   Historical Provider, MD  cyanocobalamin (,VITAMIN B-12,) 1000 MCG/ML injection Inject 1,000 mcg into the muscle every 30 (thirty) days.    Historical Provider, MD  donepezil (ARICEPT) 5 MG tablet Take 5 mg by mouth at bedtime as needed (for memory).     Historical Provider, MD  ketorolac (ACULAR) 0.5 % ophthalmic solution Place 2 drops into both eyes daily. 10/05/13   Historical Provider, MD  mirtazapine (REMERON) 15 MG tablet Take 15 mg by mouth at bedtime as needed (for mood).    Historical Provider, MD  Multiple Vitamins-Minerals (MULTIVITAMIN PO) Take 1 tablet by mouth daily.     Historical Provider, MD  mupirocin ointment (BACTROBAN) 2 % Apply 1 application topically daily as needed (to skin).  02/13/14   Historical Provider, MD  Omega-3 Fatty Acids (FISH OIL PO) Take 1 tablet by mouth daily.     Historical Provider, MD  rOPINIRole (REQUIP) 0.5 MG tablet Take 0.5 mg by mouth daily.    Historical Provider, MD  Rosuvastatin Calcium (CRESTOR PO) Take 1 tablet by mouth daily.  Historical Provider, MD  sertraline (ZOLOFT) 50 MG tablet Take 50 mg by mouth daily.  12/21/13   Historical Provider, MD  traMADol (ULTRAM) 50 MG tablet Take 1 tablet (50 mg total) by mouth every 6 (six) hours as needed. 09/23/14   Gregor Hams, MD  triamcinolone cream (KENALOG) 0.1 % Apply 1 application topically daily as needed (to skin).  12/06/13   Historical Provider, MD   BP 149/76 mmHg  Pulse 77  Temp(Src) 97.5 F (36.4 C) (Oral)  Resp 14  SpO2 99% Physical Exam Physical Exam  Constitutional: oriented to person, place, and time. appears well-developed and well-nourished. No distress.  HENT:  Head: Normocephalic and atraumatic.  Eyes: EOMI. PERRL.  Neck: Normal range of motion.   Musculoskeletal: Left arm  and ulnar gutter splint.  Neurological: alert and oriented to person, place, and time.  Skin: Extensive ecchymoses of the left hand with 2 simple facial areas of abrasion likely from rubbing up against the brace.  Psychiatric: normal mood and affect. behavior is normal. Judgment and thought content normal.   ED Course  Procedures (including critical care time) Labs Review Labs Reviewed - No data to display  Imaging Review No results found.   MDM   1. Elbow fracture, left, with nonunion, subsequent encounter   2. Abrasion    Patient to remain in ulnar gutter. Went over in great detail the need for her to call Dr. Berenice Primas in the morning. Provided contact information for the patient. Applied Tegaderm to patient's abrasions and oriented to provide additional layer protection until the brace can be changed. Patient expressed understanding about all this and will follow-up as instructed.    Waldemar Dickens, MD 09/26/14 [phone]

## 2014-09-28 DIAGNOSIS — M25522 Pain in left elbow: Secondary | ICD-10-CM | POA: Diagnosis not present

## 2014-10-05 DIAGNOSIS — M25562 Pain in left knee: Secondary | ICD-10-CM | POA: Diagnosis not present

## 2014-10-05 DIAGNOSIS — M1712 Unilateral primary osteoarthritis, left knee: Secondary | ICD-10-CM | POA: Diagnosis not present

## 2014-10-09 DIAGNOSIS — W19XXXA Unspecified fall, initial encounter: Secondary | ICD-10-CM | POA: Diagnosis not present

## 2014-10-09 DIAGNOSIS — S52022A Displaced fracture of olecranon process without intraarticular extension of left ulna, initial encounter for closed fracture: Secondary | ICD-10-CM | POA: Diagnosis not present

## 2014-10-09 DIAGNOSIS — S52032A Displaced fracture of olecranon process with intraarticular extension of left ulna, initial encounter for closed fracture: Secondary | ICD-10-CM | POA: Diagnosis not present

## 2014-10-09 DIAGNOSIS — Y929 Unspecified place or not applicable: Secondary | ICD-10-CM | POA: Diagnosis not present

## 2014-10-09 DIAGNOSIS — G8918 Other acute postprocedural pain: Secondary | ICD-10-CM | POA: Diagnosis not present

## 2014-10-23 ENCOUNTER — Emergency Department (HOSPITAL_COMMUNITY): Payer: Medicare Other

## 2014-10-23 ENCOUNTER — Encounter (HOSPITAL_COMMUNITY): Payer: Self-pay | Admitting: Emergency Medicine

## 2014-10-23 ENCOUNTER — Emergency Department (HOSPITAL_COMMUNITY)
Admission: EM | Admit: 2014-10-23 | Discharge: 2014-10-23 | Disposition: A | Discharge status: Home / Self Care | Payer: Medicare Other | Attending: Emergency Medicine | Admitting: Emergency Medicine

## 2014-10-23 DIAGNOSIS — M79622 Pain in left upper arm: Secondary | ICD-10-CM | POA: Diagnosis not present

## 2014-10-23 DIAGNOSIS — Z8719 Personal history of other diseases of the digestive system: Secondary | ICD-10-CM | POA: Insufficient documentation

## 2014-10-23 DIAGNOSIS — Z8739 Personal history of other diseases of the musculoskeletal system and connective tissue: Secondary | ICD-10-CM | POA: Diagnosis not present

## 2014-10-23 DIAGNOSIS — E785 Hyperlipidemia, unspecified: Secondary | ICD-10-CM | POA: Insufficient documentation

## 2014-10-23 DIAGNOSIS — G8918 Other acute postprocedural pain: Secondary | ICD-10-CM | POA: Diagnosis not present

## 2014-10-23 DIAGNOSIS — S52022D Displaced fracture of olecranon process without intraarticular extension of left ulna, subsequent encounter for closed fracture with routine healing: Secondary | ICD-10-CM | POA: Diagnosis not present

## 2014-10-23 DIAGNOSIS — M25512 Pain in left shoulder: Secondary | ICD-10-CM | POA: Diagnosis not present

## 2014-10-23 DIAGNOSIS — Z8673 Personal history of transient ischemic attack (TIA), and cerebral infarction without residual deficits: Secondary | ICD-10-CM | POA: Insufficient documentation

## 2014-10-23 DIAGNOSIS — Z8619 Personal history of other infectious and parasitic diseases: Secondary | ICD-10-CM | POA: Diagnosis not present

## 2014-10-23 DIAGNOSIS — Z7982 Long term (current) use of aspirin: Secondary | ICD-10-CM | POA: Insufficient documentation

## 2014-10-23 DIAGNOSIS — Z8742 Personal history of other diseases of the female genital tract: Secondary | ICD-10-CM | POA: Diagnosis not present

## 2014-10-23 DIAGNOSIS — M79602 Pain in left arm: Secondary | ICD-10-CM | POA: Diagnosis not present

## 2014-10-23 DIAGNOSIS — M79632 Pain in left forearm: Secondary | ICD-10-CM | POA: Diagnosis present

## 2014-10-23 DIAGNOSIS — Z79899 Other long term (current) drug therapy: Secondary | ICD-10-CM | POA: Diagnosis not present

## 2014-10-23 DIAGNOSIS — M25532 Pain in left wrist: Secondary | ICD-10-CM | POA: Diagnosis not present

## 2014-10-23 DIAGNOSIS — Z880 Allergy status to penicillin: Secondary | ICD-10-CM | POA: Insufficient documentation

## 2014-10-23 MED ORDER — HYDROCODONE-ACETAMINOPHEN 5-325 MG PO TABS
1.0000 | ORAL_TABLET | Freq: Once | ORAL | Status: AC
  Administered 2014-10-23: 1 via ORAL
  Filled 2014-10-23: qty 1

## 2014-10-23 NOTE — ED Provider Notes (Signed)
CSN: [id number]     Arrival date & time 10/23/14  0722 History   First MD Initiated Contact with Patient 10/23/14 [phone]     Chief Complaint  Patient presents with  . Post-op Problem  . Arm Pain     (Consider location/radiation/quality/duration/timing/severity/associated sxs/prior Treatment) HPI Complains of left arm pain at upper and and forearm onset possibly 1 AM today occurred when she rolled over in bed. It is worse with movement no other associated symptoms. Treated herself with aspirin, without relief.. No other associated symptoms. Past Medical History  Diagnosis Date  . Gilbert disease   . Atrophic vaginitis   . Vascular headache   . Mini stroke   . Stroke 4/00  . Shingles 11/02  . Diverticulosis   . Vulvitis 2004  . Osteoporosis   . Hyperlipidemia    Past Surgical History  Procedure Laterality Date  . Cataract extraction  04/2003    bilateral   Family History  Problem Relation Age of Onset  . Hypertension Mother   . Hypertension Father   . Heart disease Father   . Breast cancer Sister   . Alzheimer's disease Brother    History  Substance Use Topics  . Smoking status: Never Smoker   . Smokeless tobacco: Not on file  . Alcohol Use: No   OB History    Gravida Para Term Preterm AB TAB SAB Ectopic Multiple Living   0 0 0 0 0 0 0 0 0 0      Review of Systems  Musculoskeletal: Positive for arthralgias.       Left arm pain  All other systems reviewed and are negative.     Allergies  Prolia and Penicillins  Home Medications   Prior to Admission medications   Medication Sig Start Date End Date Taking? Authorizing Provider  aspirin 81 MG tablet Take 81 mg by mouth daily.    Historical Provider, MD  CALCIUM PO Take 1 tablet by mouth daily.     Historical Provider, MD  clobetasol cream (TEMOVATE) 1.51 % Apply 1 application topically daily as needed (for skin).  11/24/13   Historical Provider, MD  cyanocobalamin (,VITAMIN B-12,) 1000 MCG/ML injection Inject  1,000 mcg into the muscle every 30 (thirty) days.    Historical Provider, MD  donepezil (ARICEPT) 5 MG tablet Take 5 mg by mouth at bedtime as needed (for memory).     Historical Provider, MD  ketorolac (ACULAR) 0.5 % ophthalmic solution Place 2 drops into both eyes daily. 10/05/13   Historical Provider, MD  mirtazapine (REMERON) 15 MG tablet Take 15 mg by mouth at bedtime as needed (for mood).    Historical Provider, MD  Multiple Vitamins-Minerals (MULTIVITAMIN PO) Take 1 tablet by mouth daily.     Historical Provider, MD  mupirocin ointment (BACTROBAN) 2 % Apply 1 application topically daily as needed (to skin).  02/13/14   Historical Provider, MD  Omega-3 Fatty Acids (FISH OIL PO) Take 1 tablet by mouth daily.     Historical Provider, MD  rOPINIRole (REQUIP) 0.5 MG tablet Take 0.5 mg by mouth daily.    Historical Provider, MD  Rosuvastatin Calcium (CRESTOR PO) Take 1 tablet by mouth daily.     Historical Provider, MD  sertraline (ZOLOFT) 50 MG tablet Take 50 mg by mouth daily.  12/21/13   Historical Provider, MD  traMADol (ULTRAM) 50 MG tablet Take 1 tablet (50 mg total) by mouth every 6 (six) hours as needed. 09/23/14   Gregor Hams, MD  triamcinolone cream (KENALOG) 0.1 % Apply 1 application topically daily as needed (to skin).  12/06/13   Historical Provider, MD   BP 150/58 mmHg  Pulse 107  Temp(Src) 98.8 F (37.1 C) (Oral)  Resp 16  Ht 5' 4.5" (1.638 m)  Wt 100 lb (45.36 kg)  BMI 16.91 kg/m2  SpO2 97% Physical Exam  Constitutional: No distress.  Frail-appearing  HENT:  Head: Normocephalic and atraumatic.  Eyes: Conjunctivae are normal. Pupils are equal, round, and reactive to light.  Neck: Neck supple. No tracheal deviation present. No thyromegaly present.  Cardiovascular: Normal rate and regular rhythm.   No murmur heard. Pulmonary/Chest: Effort normal and breath sounds normal.  Abdominal: Soft. Bowel sounds are normal. She exhibits no distension. There is no tenderness.   Musculoskeletal: Normal range of motion. She exhibits no edema or tenderness.  Left upper extremity in a long-arm cast. She is tender at shoulder and wrist. Fingers with good capillary refill. Radial pulse 2+ all other extremities or contusion abrasion or tenderness neurovascularly intact  Neurological: She is alert. Coordination normal.  Skin: Skin is warm and dry. No rash noted.  Psychiatric: She has a normal mood and affect.  Nursing note and vitals reviewed.   ED Course  Procedures (including critical care time) Labs Review Labs Reviewed - No data to display  Imaging Review No results found.   EKG Interpretation None     9:30 AM patient is comfortable after treatment with one lichen tablet. Discomfort is minimal. X-rays viewed by me Results for orders placed or performed in visit on 10/20/13  TSH  Result Value Ref Range   TSH 1.970 0.450 - 4.500 uIU/mL  RPR  Result Value Ref Range   RPR Non Reactive Non Reactive  Hgb A1c w/o eAG  Result Value Ref Range   Hgb A1c MFr Bld 5.9 (H) 4.8 - 5.6 %  ANA w/Reflex  Result Value Ref Range   Anit Nuclear Antibody(ANA) Negative Negative   Dg Elbow Complete Left  09/23/2014   CLINICAL DATA:  Tripped in driveway and landed on elbow. Left elbow pain and swelling. Initial encounter.  EXAM: LEFT ELBOW - COMPLETE 3+ VIEW  COMPARISON:  None.  FINDINGS: A mildly displaced fracture is seen involving the olecranon process of the ulna, with intra-articular extension.  No other fractures are identified. No evidence of dislocation. Prominent soft tissue swelling noted as well as small elbow joint effusion.  IMPRESSION: Proximal ulnar fracture near the olecranon process, with intra-articular extension.   Electronically Signed   By: Earle Gell M.D.   On: 09/23/2014 20:02   Dg Forearm Left  10/23/2014   CLINICAL DATA:  Recent elbow fracture. Rolled over on arm while sleeping today. Pain.  EXAM: LEFT FOREARM - 2 VIEW  COMPARISON:  09/23/2014  FINDINGS:  Plate and screw fixation device within the proximal left ulna noted across the fracture at the base of the olecranon process. Anatomic alignment. No hardware or bony complicating feature. No additional fracture.  IMPRESSION: Internal fixation across the fracture at the base of the olecranon process. No hardware complicating feature.   Electronically Signed   By: Rolm Baptise M.D.   On: 10/23/2014 08:45   Dg Humerus Left  10/23/2014   CLINICAL DATA:  Reason elbow fracture. Rolled over on arm while sleeping today. Generalized pain.  EXAM: LEFT HUMERUS - 2+ VIEW  COMPARISON:  None.  FINDINGS: Plate and screw fixation device noted across the fracture at the base of the left proximal  ulna olecranon process. Mild degenerative changes within the left shoulder. No acute bony abnormality within the humerus. No fracture, subluxation or dislocation.  IMPRESSION: No acute bony abnormality within the humerus.  Internal fixation across the fracture at the base of the left olecranon process in anatomic alignment.   Electronically Signed   By: Rolm Baptise M.D.   On: 10/23/2014 08:46    MDM  I see signs of compartment syndrome. She has good capillary refill, hand is warm. she has appointment scheduled with Dr. Berenice Primas for tomorrow. Cast is comfortable for patient presently Final diagnoses:  None    Diagnosis left arm pain     Orlie Dakin, MD 10/23/14 [phone]

## 2014-10-23 NOTE — ED Notes (Signed)
Patient transported to X-ray 

## 2014-10-23 NOTE — ED Notes (Signed)
Pt c/o left arm pain that cannot be controlled with medication at home. Pt had surgery to left arm on June 20th. Pt had fell on the June 4th.

## 2014-10-23 NOTE — Discharge Instructions (Signed)
Keep your scheduled appointment with Dr. Berenice Primas tomorrow. Take your medications as prescribed. Return if you feel worse for any reason.

## 2014-10-24 DIAGNOSIS — Z9889 Other specified postprocedural states: Secondary | ICD-10-CM | POA: Diagnosis not present

## 2014-10-30 DIAGNOSIS — I10 Essential (primary) hypertension: Secondary | ICD-10-CM | POA: Diagnosis not present

## 2014-10-31 DIAGNOSIS — Z9889 Other specified postprocedural states: Secondary | ICD-10-CM | POA: Diagnosis not present

## 2014-11-02 DIAGNOSIS — I8390 Asymptomatic varicose veins of unspecified lower extremity: Secondary | ICD-10-CM | POA: Diagnosis not present

## 2014-11-02 DIAGNOSIS — M810 Age-related osteoporosis without current pathological fracture: Secondary | ICD-10-CM | POA: Diagnosis not present

## 2014-11-02 DIAGNOSIS — R636 Underweight: Secondary | ICD-10-CM | POA: Diagnosis not present

## 2014-11-02 DIAGNOSIS — R413 Other amnesia: Secondary | ICD-10-CM | POA: Diagnosis not present

## 2014-11-09 DIAGNOSIS — Z9889 Other specified postprocedural states: Secondary | ICD-10-CM | POA: Diagnosis not present

## 2014-11-15 ENCOUNTER — Encounter (INDEPENDENT_AMBULATORY_CARE_PROVIDER_SITE_OTHER): Payer: Medicare Other | Admitting: Ophthalmology

## 2014-11-15 DIAGNOSIS — H35033 Hypertensive retinopathy, bilateral: Secondary | ICD-10-CM

## 2014-11-15 DIAGNOSIS — H3532 Exudative age-related macular degeneration: Secondary | ICD-10-CM | POA: Diagnosis not present

## 2014-11-15 DIAGNOSIS — H43813 Vitreous degeneration, bilateral: Secondary | ICD-10-CM | POA: Diagnosis not present

## 2014-11-15 DIAGNOSIS — I10 Essential (primary) hypertension: Secondary | ICD-10-CM | POA: Diagnosis not present

## 2014-11-22 DIAGNOSIS — S52022D Displaced fracture of olecranon process without intraarticular extension of left ulna, subsequent encounter for closed fracture with routine healing: Secondary | ICD-10-CM | POA: Diagnosis not present

## 2014-11-22 DIAGNOSIS — Z9181 History of falling: Secondary | ICD-10-CM | POA: Diagnosis not present

## 2014-11-24 DIAGNOSIS — Z9181 History of falling: Secondary | ICD-10-CM | POA: Diagnosis not present

## 2014-11-24 DIAGNOSIS — S52022D Displaced fracture of olecranon process without intraarticular extension of left ulna, subsequent encounter for closed fracture with routine healing: Secondary | ICD-10-CM | POA: Diagnosis not present

## 2014-11-28 DIAGNOSIS — Z9181 History of falling: Secondary | ICD-10-CM | POA: Diagnosis not present

## 2014-11-28 DIAGNOSIS — S52022D Displaced fracture of olecranon process without intraarticular extension of left ulna, subsequent encounter for closed fracture with routine healing: Secondary | ICD-10-CM | POA: Diagnosis not present

## 2014-11-30 DIAGNOSIS — Z9889 Other specified postprocedural states: Secondary | ICD-10-CM | POA: Diagnosis not present

## 2014-12-01 DIAGNOSIS — S52022D Displaced fracture of olecranon process without intraarticular extension of left ulna, subsequent encounter for closed fracture with routine healing: Secondary | ICD-10-CM | POA: Diagnosis not present

## 2014-12-01 DIAGNOSIS — Z9181 History of falling: Secondary | ICD-10-CM | POA: Diagnosis not present

## 2014-12-05 DIAGNOSIS — S52022D Displaced fracture of olecranon process without intraarticular extension of left ulna, subsequent encounter for closed fracture with routine healing: Secondary | ICD-10-CM | POA: Diagnosis not present

## 2014-12-05 DIAGNOSIS — Z9181 History of falling: Secondary | ICD-10-CM | POA: Diagnosis not present

## 2014-12-06 DIAGNOSIS — R413 Other amnesia: Secondary | ICD-10-CM | POA: Diagnosis not present

## 2014-12-06 DIAGNOSIS — L299 Pruritus, unspecified: Secondary | ICD-10-CM | POA: Diagnosis not present

## 2014-12-06 DIAGNOSIS — M1990 Unspecified osteoarthritis, unspecified site: Secondary | ICD-10-CM | POA: Diagnosis not present

## 2014-12-06 DIAGNOSIS — M25552 Pain in left hip: Secondary | ICD-10-CM | POA: Diagnosis not present

## 2014-12-06 DIAGNOSIS — M19042 Primary osteoarthritis, left hand: Secondary | ICD-10-CM | POA: Diagnosis not present

## 2014-12-06 DIAGNOSIS — M7501 Adhesive capsulitis of right shoulder: Secondary | ICD-10-CM | POA: Diagnosis not present

## 2014-12-06 DIAGNOSIS — M19011 Primary osteoarthritis, right shoulder: Secondary | ICD-10-CM | POA: Diagnosis not present

## 2014-12-07 DIAGNOSIS — S52022D Displaced fracture of olecranon process without intraarticular extension of left ulna, subsequent encounter for closed fracture with routine healing: Secondary | ICD-10-CM | POA: Diagnosis not present

## 2014-12-07 DIAGNOSIS — Z9181 History of falling: Secondary | ICD-10-CM | POA: Diagnosis not present

## 2014-12-08 DIAGNOSIS — S52022D Displaced fracture of olecranon process without intraarticular extension of left ulna, subsequent encounter for closed fracture with routine healing: Secondary | ICD-10-CM | POA: Diagnosis not present

## 2014-12-08 DIAGNOSIS — Z9181 History of falling: Secondary | ICD-10-CM | POA: Diagnosis not present

## 2014-12-11 DIAGNOSIS — S52022D Displaced fracture of olecranon process without intraarticular extension of left ulna, subsequent encounter for closed fracture with routine healing: Secondary | ICD-10-CM | POA: Diagnosis not present

## 2014-12-11 DIAGNOSIS — Z9181 History of falling: Secondary | ICD-10-CM | POA: Diagnosis not present

## 2014-12-13 DIAGNOSIS — Z9181 History of falling: Secondary | ICD-10-CM | POA: Diagnosis not present

## 2014-12-13 DIAGNOSIS — S52022D Displaced fracture of olecranon process without intraarticular extension of left ulna, subsequent encounter for closed fracture with routine healing: Secondary | ICD-10-CM | POA: Diagnosis not present

## 2014-12-14 DIAGNOSIS — S52022D Displaced fracture of olecranon process without intraarticular extension of left ulna, subsequent encounter for closed fracture with routine healing: Secondary | ICD-10-CM | POA: Diagnosis not present

## 2014-12-14 DIAGNOSIS — Z9181 History of falling: Secondary | ICD-10-CM | POA: Diagnosis not present

## 2014-12-19 DIAGNOSIS — Z9181 History of falling: Secondary | ICD-10-CM | POA: Diagnosis not present

## 2014-12-19 DIAGNOSIS — S52022D Displaced fracture of olecranon process without intraarticular extension of left ulna, subsequent encounter for closed fracture with routine healing: Secondary | ICD-10-CM | POA: Diagnosis not present

## 2014-12-20 DIAGNOSIS — S52022D Displaced fracture of olecranon process without intraarticular extension of left ulna, subsequent encounter for closed fracture with routine healing: Secondary | ICD-10-CM | POA: Diagnosis not present

## 2014-12-20 DIAGNOSIS — Z9181 History of falling: Secondary | ICD-10-CM | POA: Diagnosis not present

## 2014-12-21 DIAGNOSIS — Z9181 History of falling: Secondary | ICD-10-CM | POA: Diagnosis not present

## 2014-12-21 DIAGNOSIS — S52022D Displaced fracture of olecranon process without intraarticular extension of left ulna, subsequent encounter for closed fracture with routine healing: Secondary | ICD-10-CM | POA: Diagnosis not present

## 2014-12-25 DIAGNOSIS — S52022D Displaced fracture of olecranon process without intraarticular extension of left ulna, subsequent encounter for closed fracture with routine healing: Secondary | ICD-10-CM | POA: Diagnosis not present

## 2014-12-25 DIAGNOSIS — Z9181 History of falling: Secondary | ICD-10-CM | POA: Diagnosis not present

## 2014-12-28 DIAGNOSIS — Z9181 History of falling: Secondary | ICD-10-CM | POA: Diagnosis not present

## 2014-12-28 DIAGNOSIS — S52022D Displaced fracture of olecranon process without intraarticular extension of left ulna, subsequent encounter for closed fracture with routine healing: Secondary | ICD-10-CM | POA: Diagnosis not present

## 2014-12-29 DIAGNOSIS — Z9181 History of falling: Secondary | ICD-10-CM | POA: Diagnosis not present

## 2014-12-29 DIAGNOSIS — S52022D Displaced fracture of olecranon process without intraarticular extension of left ulna, subsequent encounter for closed fracture with routine healing: Secondary | ICD-10-CM | POA: Diagnosis not present

## 2015-01-02 DIAGNOSIS — S52022D Displaced fracture of olecranon process without intraarticular extension of left ulna, subsequent encounter for closed fracture with routine healing: Secondary | ICD-10-CM | POA: Diagnosis not present

## 2015-01-02 DIAGNOSIS — Z9181 History of falling: Secondary | ICD-10-CM | POA: Diagnosis not present

## 2015-01-03 DIAGNOSIS — Z9181 History of falling: Secondary | ICD-10-CM | POA: Diagnosis not present

## 2015-01-03 DIAGNOSIS — S52022D Displaced fracture of olecranon process without intraarticular extension of left ulna, subsequent encounter for closed fracture with routine healing: Secondary | ICD-10-CM | POA: Diagnosis not present

## 2015-01-04 DIAGNOSIS — Z9181 History of falling: Secondary | ICD-10-CM | POA: Diagnosis not present

## 2015-01-04 DIAGNOSIS — S52022D Displaced fracture of olecranon process without intraarticular extension of left ulna, subsequent encounter for closed fracture with routine healing: Secondary | ICD-10-CM | POA: Diagnosis not present

## 2015-01-05 DIAGNOSIS — S52022D Displaced fracture of olecranon process without intraarticular extension of left ulna, subsequent encounter for closed fracture with routine healing: Secondary | ICD-10-CM | POA: Diagnosis not present

## 2015-01-05 DIAGNOSIS — Z9181 History of falling: Secondary | ICD-10-CM | POA: Diagnosis not present

## 2015-01-09 DIAGNOSIS — S52022D Displaced fracture of olecranon process without intraarticular extension of left ulna, subsequent encounter for closed fracture with routine healing: Secondary | ICD-10-CM | POA: Diagnosis not present

## 2015-01-09 DIAGNOSIS — Z9181 History of falling: Secondary | ICD-10-CM | POA: Diagnosis not present

## 2015-01-10 DIAGNOSIS — Z9181 History of falling: Secondary | ICD-10-CM | POA: Diagnosis not present

## 2015-01-10 DIAGNOSIS — S52022D Displaced fracture of olecranon process without intraarticular extension of left ulna, subsequent encounter for closed fracture with routine healing: Secondary | ICD-10-CM | POA: Diagnosis not present

## 2015-01-12 DIAGNOSIS — S52022D Displaced fracture of olecranon process without intraarticular extension of left ulna, subsequent encounter for closed fracture with routine healing: Secondary | ICD-10-CM | POA: Diagnosis not present

## 2015-01-12 DIAGNOSIS — Z9181 History of falling: Secondary | ICD-10-CM | POA: Diagnosis not present

## 2015-01-15 DIAGNOSIS — Z9181 History of falling: Secondary | ICD-10-CM | POA: Diagnosis not present

## 2015-01-15 DIAGNOSIS — S52022D Displaced fracture of olecranon process without intraarticular extension of left ulna, subsequent encounter for closed fracture with routine healing: Secondary | ICD-10-CM | POA: Diagnosis not present

## 2015-01-18 DIAGNOSIS — M25522 Pain in left elbow: Secondary | ICD-10-CM | POA: Diagnosis not present

## 2015-01-18 DIAGNOSIS — Z9181 History of falling: Secondary | ICD-10-CM | POA: Diagnosis not present

## 2015-01-18 DIAGNOSIS — Z967 Presence of other bone and tendon implants: Secondary | ICD-10-CM | POA: Diagnosis not present

## 2015-01-18 DIAGNOSIS — M19011 Primary osteoarthritis, right shoulder: Secondary | ICD-10-CM | POA: Diagnosis not present

## 2015-01-18 DIAGNOSIS — S52022D Displaced fracture of olecranon process without intraarticular extension of left ulna, subsequent encounter for closed fracture with routine healing: Secondary | ICD-10-CM | POA: Diagnosis not present

## 2015-01-21 DIAGNOSIS — I10 Essential (primary) hypertension: Secondary | ICD-10-CM | POA: Diagnosis not present

## 2015-01-21 DIAGNOSIS — F0390 Unspecified dementia without behavioral disturbance: Secondary | ICD-10-CM | POA: Diagnosis not present

## 2015-01-21 DIAGNOSIS — M419 Scoliosis, unspecified: Secondary | ICD-10-CM | POA: Diagnosis not present

## 2015-01-21 DIAGNOSIS — R2689 Other abnormalities of gait and mobility: Secondary | ICD-10-CM | POA: Diagnosis not present

## 2015-01-21 DIAGNOSIS — M1990 Unspecified osteoarthritis, unspecified site: Secondary | ICD-10-CM | POA: Diagnosis not present

## 2015-01-21 DIAGNOSIS — M6281 Muscle weakness (generalized): Secondary | ICD-10-CM | POA: Diagnosis not present

## 2015-01-22 ENCOUNTER — Telehealth: Payer: Self-pay | Admitting: Neurology

## 2015-01-22 DIAGNOSIS — F0390 Unspecified dementia without behavioral disturbance: Secondary | ICD-10-CM | POA: Diagnosis not present

## 2015-01-22 DIAGNOSIS — M419 Scoliosis, unspecified: Secondary | ICD-10-CM | POA: Diagnosis not present

## 2015-01-22 DIAGNOSIS — R2689 Other abnormalities of gait and mobility: Secondary | ICD-10-CM | POA: Diagnosis not present

## 2015-01-22 DIAGNOSIS — M1990 Unspecified osteoarthritis, unspecified site: Secondary | ICD-10-CM | POA: Diagnosis not present

## 2015-01-22 DIAGNOSIS — M6281 Muscle weakness (generalized): Secondary | ICD-10-CM | POA: Diagnosis not present

## 2015-01-22 DIAGNOSIS — I10 Essential (primary) hypertension: Secondary | ICD-10-CM | POA: Diagnosis not present

## 2015-01-22 NOTE — Telephone Encounter (Signed)
Last seen 10/20/13. Do you want her to make f/u? Or have another recommendation?

## 2015-01-22 NOTE — Telephone Encounter (Signed)
Please call patient back. She will need to be seen for an appointment. In addition, she may need formal neuropsychological testing with a neuropsychologist. We can certainly discuss this during a follow-up appointment. Please schedule and notify patient or family.

## 2015-01-22 NOTE — Telephone Encounter (Signed)
Phone Call from Niece [name] [phone] called requesting memory test for ability to do estate planning with [facility]. Lawyer felt it would be a good idea to do memory testing to see if patient is competent to go forward with estate planning. Missed last scheduled appointment 03/28/14.

## 2015-01-23 DIAGNOSIS — M419 Scoliosis, unspecified: Secondary | ICD-10-CM | POA: Diagnosis not present

## 2015-01-23 DIAGNOSIS — F0390 Unspecified dementia without behavioral disturbance: Secondary | ICD-10-CM | POA: Diagnosis not present

## 2015-01-23 DIAGNOSIS — I10 Essential (primary) hypertension: Secondary | ICD-10-CM | POA: Diagnosis not present

## 2015-01-23 DIAGNOSIS — M1990 Unspecified osteoarthritis, unspecified site: Secondary | ICD-10-CM | POA: Diagnosis not present

## 2015-01-23 DIAGNOSIS — M6281 Muscle weakness (generalized): Secondary | ICD-10-CM | POA: Diagnosis not present

## 2015-01-23 DIAGNOSIS — R2689 Other abnormalities of gait and mobility: Secondary | ICD-10-CM | POA: Diagnosis not present

## 2015-01-23 NOTE — Telephone Encounter (Signed)
Left message on Brenda's vm stating that patient will need to come in for office visit to see Dr. Rexene Alberts. At that point Dr. Rexene Alberts can order additional testing if needed. Patient needs 94min appt with Dr. Rexene Alberts.

## 2015-01-25 ENCOUNTER — Ambulatory Visit (INDEPENDENT_AMBULATORY_CARE_PROVIDER_SITE_OTHER): Payer: Medicare Other | Admitting: Neurology

## 2015-01-25 ENCOUNTER — Encounter: Payer: Self-pay | Admitting: Neurology

## 2015-01-25 VITALS — BP 140/68 | HR 78 | Resp 16 | Ht 63.0 in | Wt 120.0 lb

## 2015-01-25 DIAGNOSIS — M6281 Muscle weakness (generalized): Secondary | ICD-10-CM | POA: Diagnosis not present

## 2015-01-25 DIAGNOSIS — F0390 Unspecified dementia without behavioral disturbance: Secondary | ICD-10-CM

## 2015-01-25 DIAGNOSIS — M1990 Unspecified osteoarthritis, unspecified site: Secondary | ICD-10-CM | POA: Diagnosis not present

## 2015-01-25 DIAGNOSIS — R2689 Other abnormalities of gait and mobility: Secondary | ICD-10-CM | POA: Diagnosis not present

## 2015-01-25 DIAGNOSIS — I10 Essential (primary) hypertension: Secondary | ICD-10-CM | POA: Diagnosis not present

## 2015-01-25 DIAGNOSIS — M419 Scoliosis, unspecified: Secondary | ICD-10-CM | POA: Diagnosis not present

## 2015-01-25 NOTE — Progress Notes (Addendum)
Subjective:    Patient ID: [name] is a 81 y.o. female.  HPI     Interim history:   [name] is a very pleasant 81 year old right-handed woman with an underlying complex medical history of osteoarthritis, reflux disease, recurrent headaches, history of TIA, vitamin B12 deficiency, diverticulosis, restless leg syndrome, chronic constipation, scoliosis, osteoporosis, anxiety, who presents for follow-up consultation of her memory loss. She is accompanied by her niece, [name] Apgar, today (Brenda's sister, [name], was here last time). Of note, she no showed for appointment on 03/28/2014. I first met her on 10/20/2013 at the request of her primary care physician, at which time she reported a one year history of memory loss. Her MMSE was 22/30 at the time. She had been started on Aricept 5 mg in May 2015 and I suggested she discuss with her primary care physician increasing the dose to 10 mg daily. I also suggested a repeat head CT without contrast to compare with her scan from 2013. She did not have a head CT done. I did some blood work on 10/25/2013. She had a normal TSH, normal RPR, normal ANA and borderline hemoglobin A1c of 5.9. We called her with the test results.  Today, 01/25/15: she reports doing fairly. Most of her history is provided by [name], who states, [name] is no longer in the picture. Patient is in her own home, one story, has a rolling walker w seat, which she has been using for the past 2 months. She has HH therapies. Her donepezil was increased to 10 mg in July or August, per [name]. She was then switched to eigther Namenda XR or Namzaric, unclear exactly. Of note, she recently fell on 09/23/2014 and sustained an elbow fracture on the left. She presented to urgent care and went back to UC on 10/23/2014 with left arm pain. [name] came to stay in mid-June, after the fall. She takes trazodone 100 mg each night.   Previously:  10/20/2013: She has had memory loss for the past  year. She lives alone in a one story home. She recently had blood work on 09/15/2013 which I reviewed: CBC was unremarkable, BMP showed a BUN of 19, total bili was borderline, total cholesterol was borderline at 203, LDL was 117, urinalysis was negative. You recently started her on generic Aricept 5 mg in 5/15 and she has been on it since then and takes 5 mg. She drives only locally, not at night, not on the interstate.     She had head CT without contrast on 05/13/2011 after a fall: No acute intracranial abnormality. 2. Atrophy and chronic microvascular white matter ischemic changes.  She is the youngest of a total of 13 kids, has no living siblings, no children, is widowed. Her niece is the closest relative and lives in [place].   She gets Meals on Wheels. There is no issue with hallucinations, no delusions. She would be agreeable to have an aid during the day.    Her Past Medical History Is Significant For: Past Medical History  Diagnosis Date  . Gilbert disease   . Atrophic vaginitis   . Vascular headache   . Mini stroke ([place])   . Stroke ([place]) 4/00  . Shingles 11/02  . Diverticulosis   . Vulvitis 2004  . Osteoporosis   . Hyperlipidemia     Her Past Surgical History Is Significant For: Past Surgical History  Procedure Laterality Date  . Cataract extraction  04/2003    bilateral    Her  Family History Is Significant For: Family History  Problem Relation Age of Onset  . Hypertension Mother   . Hypertension Father   . Heart disease Father   . Breast cancer Sister   . Alzheimer's disease Brother     Her Social History Is Significant For: Social History   Social History  . Marital Status: Widowed    Spouse Name: N/A  . Number of Children: N/A  . Years of Education: N/A   Social History Main Topics  . Smoking status: Never Smoker   . Smokeless tobacco: None  . Alcohol Use: No  . Drug Use: No  . Sexual Activity: Not Asked   Other Topics Concern  . None    Social History Narrative    Her Allergies Are:  Allergies  Allergen Reactions  . Prolia [Denosumab]   . Penicillins Rash  :   Her Current Medications Are:  Outpatient Encounter Prescriptions as of 01/25/2015  Medication Sig  . NAMENDA XR 28 MG CP24 24 hr capsule   . traZODone (DESYREL) 100 MG tablet Take 100 mg by mouth at bedtime.  . [DISCONTINUED] aspirin 81 MG tablet Take 81 mg by mouth daily.  . [DISCONTINUED] CALCIUM PO Take 1 tablet by mouth daily.   . [DISCONTINUED] clobetasol cream (TEMOVATE) 1.61 % Apply 1 application topically daily as needed (for skin).   . [DISCONTINUED] cyanocobalamin (,VITAMIN B-12,) 1000 MCG/ML injection Inject 1,000 mcg into the muscle every 30 (thirty) days.  . [DISCONTINUED] donepezil (ARICEPT) 5 MG tablet Take 5 mg by mouth at bedtime as needed (for memory).   . [DISCONTINUED] ketorolac (ACULAR) 0.5 % ophthalmic solution Place 2 drops into both eyes daily.  . [DISCONTINUED] mirtazapine (REMERON) 15 MG tablet Take 15 mg by mouth at bedtime as needed (for mood).  . [DISCONTINUED] Multiple Vitamins-Minerals (MULTIVITAMIN PO) Take 1 tablet by mouth daily.   . [DISCONTINUED] mupirocin ointment (BACTROBAN) 2 % Apply 1 application topically daily as needed (to skin).   . [DISCONTINUED] Omega-3 Fatty Acids (FISH OIL PO) Take 1 tablet by mouth daily.   . [DISCONTINUED] rOPINIRole (REQUIP) 0.5 MG tablet Take 0.5 mg by mouth daily.  . [DISCONTINUED] Rosuvastatin Calcium (CRESTOR PO) Take 1 tablet by mouth daily.   . [DISCONTINUED] sertraline (ZOLOFT) 50 MG tablet Take 50 mg by mouth daily.   . [DISCONTINUED] traMADol (ULTRAM) 50 MG tablet Take 1 tablet (50 mg total) by mouth every 6 (six) hours as needed. (Patient not taking: Reported on 10/23/2014)  . [DISCONTINUED] traZODone (DESYREL) 50 MG tablet Take 50 mg by mouth at bedtime.  . [DISCONTINUED] triamcinolone cream (KENALOG) 0.1 % Apply 1 application topically daily as needed (to skin).    No  facility-administered encounter medications on file as of 01/25/2015.  :  Review of Systems:  Out of a complete 14 point review of systems, all are reviewed and negative with the exception of these symptoms as listed below:   Review of Systems  Neurological:       Patient fell a couple of weeks ago due to unsteady balance. She broke elbow during fall.   Patient requesting memory testing. She needs letter written that states she is mental capable to work with attorney and make decisions about estate etc.     Objective:  Neurologic Exam  Physical Exam Physical Examination:   Filed Vitals:   01/25/15 1325  BP: 140/68  Pulse: 78  Resp: 16     General Examination: The patient is a very pleasant 81 y.o.  female in no acute distress. She is calm and cooperative with the exam. She denies Auditory Hallucinations and Visual Hallucinations. She is well groomed and situated in a chair.   HEENT: Normocephalic, atraumatic, pupils are equal, round and reactive to light and accommodation. Funduscopic exam is normal with sharp disc margins noted. Extraocular tracking shows no saccadic breakdown without nystagmus noted. Hearing is intact. Face is symmetric with no facial masking and normal facial sensation. There is no lip, neck or jaw tremor. Neck is mildly rigid with intact passive ROM. There are no carotid bruits on auscultation. Oropharynx exam reveals moderate mouth dryness. No significant airway crowding is noted. Mallampati is class II. Tongue protrudes centrally and palate elevates symmetrically.    Chest: is clear to auscultation without wheezing, rhonchi or crackles noted.  Heart: sounds are regular and normal without murmurs, rubs or gallops noted.   Abdomen: is soft, non-tender and non-distended with normal bowel sounds appreciated on auscultation.  Extremities: There is no pitting edema in the distal lower extremities bilaterally. Pedal pulses are intact.   Skin: is warm and dry with  no trophic changes noted. Age-related changes are noted on the skin. Multiple small bruises of varying ages are noted on the forearms. She has a rash in the distal LEs, L more than R.   Musculoskeletal: exam reveals no obvious joint deformities, tenderness or joint swelling or erythema. Changes consistent with OA of the hands are noted bilaterally.   Neurologically:  Mental status: The patient is awake and alert, paying good  attention. She is able to partially provide the history. Her niece provides details. She is oriented to: person, place, time/date, situation, day of week and month of year. Her memory, attention, language and knowledge are impaired. There is no aphasia, agnosia, apraxia or anomia. There is a no significant degree of bradyphrenia. Speech is not hypophonic with no dysarthria noted. Speech is rapid and a little pressured and she needs multiple redirections. Mood is congruent and affect is normal.   In 7/15: Her MMSE (Mini-Mental state exam) score is 22/30. CDT (Clock Drawing Test) score is 4/4.  AFT (Animal Fluency Test) score is 18.   On 01/25/2015: MMSE: 19/30, CDT: 1/4, AFT: 6/min.  Cranial nerves are as described above under HEENT exam. In addition, shoulder shrug is normal with equal shoulder height noted.  Motor exam: Normal bulk, and strength for age is noted. Tone is not rigid with absence of cogwheeling in the extremities. There is overall no bradykinesia. There is no drift or rebound. There is no tremor.  Romberg is positive. Reflexes are 1+ in the upper extremities and 1+ in the lower extremities. Toes are downgoing bilaterally. Fine motor skills: Finger taps, hand movements, and rapid alternating patting are mildly impaired bilaterally. Foot taps and foot agility are mildly impaired bilaterally.   Cerebellar testing shows no dysmetria or intention tremor on finger to nose testing. Heel to shin is unremarkable. There is no truncal or gait ataxia.   Sensory exam is  intact to light touch in the upper and lower extremities.   Gait, station and balance: She stands up from the seated position with mild difficulty and needs to push herself up. No veering to one side is noted. No leaning to one side. Posture is moderately stooped and there is significant kyphoscoliosis. Stance is wide-based. She turns in 3 steps. Tandem walk is not possible. Balance is mildly impaired. She uses her walker well.    Assessment and Plan:   In  summary, Zuriyah L Boyum is a very pleasant 81 year old female with an underlying complex medical history of osteoarthritis, reflux disease, recurrent headaches, history of TIA, vitamin B12 deficiency, diverticulosis, restless leg syndrome, chronic constipation, scoliosis, osteoporosis, anxiety, who presents for follow up consultation of her memory loss of over 2 years' duration. Her memory scores have declined. She did not have the CT head I ordered in 7/15. She recently increased the Aricept to 10 mg, then Namenda XR was added or she was switched to namzaric, unclear exactly.   I had a long chat with the patient and her niece, [name], about my findings and the diagnosis of dementia, its prognosis and treatment options. Implications of diagnosis explained at length with the patient and caregiver. We talked about medical treatments and non-pharmacological approaches. We talked about maintaining a healthy lifestyle in general and staying active mentally and physically. I encouraged the patient to eat healthy, exercise daily and keep well hydrated, to keep a scheduled bedtime and wake time routine, to not skip any meals and eat healthy snacks in between meals and to have protein with every meal. I stressed the importance of regular exercise, within of course the patient's own mobility limitations. I encouraged the patient to keep up with current events by reading the news paper or watching the news and to do word puzzles, or if feasible, to go on  [email or web address].   As far as further diagnostic testing is concerned, I suggested the following: CT head for comparison from 2013 and I reordered today.  Her niece is requesting a letter for her lawyer for estate planning. I suggested, that she get full cognitive testing done under neuropsychology, I made a referral today.  As far as medications are concerned, I recommended the following at this time: no change as yet. [name] will call with what Ms. Sockwell is on.    I answered all their questions today and the patient and her niece were in agreement with the above outlined plan. I would like to see the patient back in 4 months, sooner if the need arises and encouraged them to call with any interim questions, concerns, problems.

## 2015-01-25 NOTE — Patient Instructions (Signed)
Your memory scores have declined. I want to suggest a few things today:  Remember to drink plenty of fluid, eat healthy meals and do not skip any meals. Try to eat protein with a every meal and eat a healthy snack such as fruit or nuts in between meals. Try to keep a regular sleep-wake schedule and try to exercise daily, particularly in the form of walking, 10-20 minutes a day, if you can. Good nutrition, proper sleep and exercise can help her cognitive function.  Use your walker at all times, as you are at fall risk.   Engage in social activities in your community and with your family and try to keep up with current events by reading the newspaper or watching the news. If you have computer and can go online, try [email or web address]. Also, you may like to do word finding puzzles or crossword puzzles.  As far as your medications are concerned, I would like to suggest no changes. Please call with which memory medications you are on. [name] can call us.    As far as diagnostic testing: we will do a CT head. I will refer you to neuropsychology for full cognitive testing.   I would like to see you back in about 4 months, sooner if we need to. Please call us with any interim questions, concerns, problems, updates or refill requests.  Please also call us for any test results so we can go over those with you on the phone. Richardson Landry is my clinical assistant and will answer any of your questions and relay your messages to me and also relay most of my messages to you.  Our phone number is [phone]. We also have an after hours call service for urgent matters and there is a physician on-call for urgent questions. For any emergencies you know to call 911 or go to the nearest emergency room.

## 2015-01-26 DIAGNOSIS — R2689 Other abnormalities of gait and mobility: Secondary | ICD-10-CM | POA: Diagnosis not present

## 2015-01-26 DIAGNOSIS — M6281 Muscle weakness (generalized): Secondary | ICD-10-CM | POA: Diagnosis not present

## 2015-01-26 DIAGNOSIS — M1990 Unspecified osteoarthritis, unspecified site: Secondary | ICD-10-CM | POA: Diagnosis not present

## 2015-01-26 DIAGNOSIS — F0390 Unspecified dementia without behavioral disturbance: Secondary | ICD-10-CM | POA: Diagnosis not present

## 2015-01-26 DIAGNOSIS — I10 Essential (primary) hypertension: Secondary | ICD-10-CM | POA: Diagnosis not present

## 2015-01-26 DIAGNOSIS — M419 Scoliosis, unspecified: Secondary | ICD-10-CM | POA: Diagnosis not present

## 2015-01-29 DIAGNOSIS — R2689 Other abnormalities of gait and mobility: Secondary | ICD-10-CM | POA: Diagnosis not present

## 2015-01-29 DIAGNOSIS — M419 Scoliosis, unspecified: Secondary | ICD-10-CM | POA: Diagnosis not present

## 2015-01-29 DIAGNOSIS — M6281 Muscle weakness (generalized): Secondary | ICD-10-CM | POA: Diagnosis not present

## 2015-01-29 DIAGNOSIS — F0390 Unspecified dementia without behavioral disturbance: Secondary | ICD-10-CM | POA: Diagnosis not present

## 2015-01-29 DIAGNOSIS — I10 Essential (primary) hypertension: Secondary | ICD-10-CM | POA: Diagnosis not present

## 2015-01-29 DIAGNOSIS — M1990 Unspecified osteoarthritis, unspecified site: Secondary | ICD-10-CM | POA: Diagnosis not present

## 2015-01-31 DIAGNOSIS — M6281 Muscle weakness (generalized): Secondary | ICD-10-CM | POA: Diagnosis not present

## 2015-01-31 DIAGNOSIS — M419 Scoliosis, unspecified: Secondary | ICD-10-CM | POA: Diagnosis not present

## 2015-01-31 DIAGNOSIS — F0390 Unspecified dementia without behavioral disturbance: Secondary | ICD-10-CM | POA: Diagnosis not present

## 2015-01-31 DIAGNOSIS — I10 Essential (primary) hypertension: Secondary | ICD-10-CM | POA: Diagnosis not present

## 2015-01-31 DIAGNOSIS — R2689 Other abnormalities of gait and mobility: Secondary | ICD-10-CM | POA: Diagnosis not present

## 2015-01-31 DIAGNOSIS — M1990 Unspecified osteoarthritis, unspecified site: Secondary | ICD-10-CM | POA: Diagnosis not present

## 2015-02-05 DIAGNOSIS — M419 Scoliosis, unspecified: Secondary | ICD-10-CM | POA: Diagnosis not present

## 2015-02-05 DIAGNOSIS — R2689 Other abnormalities of gait and mobility: Secondary | ICD-10-CM | POA: Diagnosis not present

## 2015-02-05 DIAGNOSIS — I10 Essential (primary) hypertension: Secondary | ICD-10-CM | POA: Diagnosis not present

## 2015-02-05 DIAGNOSIS — F0390 Unspecified dementia without behavioral disturbance: Secondary | ICD-10-CM | POA: Diagnosis not present

## 2015-02-05 DIAGNOSIS — M6281 Muscle weakness (generalized): Secondary | ICD-10-CM | POA: Diagnosis not present

## 2015-02-05 DIAGNOSIS — M1990 Unspecified osteoarthritis, unspecified site: Secondary | ICD-10-CM | POA: Diagnosis not present

## 2015-02-06 DIAGNOSIS — R413 Other amnesia: Secondary | ICD-10-CM | POA: Diagnosis not present

## 2015-02-06 DIAGNOSIS — Z23 Encounter for immunization: Secondary | ICD-10-CM | POA: Diagnosis not present

## 2015-02-06 DIAGNOSIS — R269 Unspecified abnormalities of gait and mobility: Secondary | ICD-10-CM | POA: Diagnosis not present

## 2015-02-07 ENCOUNTER — Telehealth: Payer: Self-pay

## 2015-02-07 DIAGNOSIS — R2689 Other abnormalities of gait and mobility: Secondary | ICD-10-CM | POA: Diagnosis not present

## 2015-02-07 DIAGNOSIS — M419 Scoliosis, unspecified: Secondary | ICD-10-CM | POA: Diagnosis not present

## 2015-02-07 DIAGNOSIS — I10 Essential (primary) hypertension: Secondary | ICD-10-CM | POA: Diagnosis not present

## 2015-02-07 DIAGNOSIS — M6281 Muscle weakness (generalized): Secondary | ICD-10-CM | POA: Diagnosis not present

## 2015-02-07 DIAGNOSIS — M1990 Unspecified osteoarthritis, unspecified site: Secondary | ICD-10-CM | POA: Diagnosis not present

## 2015-02-07 DIAGNOSIS — F0390 Unspecified dementia without behavioral disturbance: Secondary | ICD-10-CM | POA: Diagnosis not present

## 2015-02-07 NOTE — Telephone Encounter (Signed)
-----   Message from Star Age, MD sent at 02/07/2015  4:33 PM EDT ----- That is INcorrect. I said, we SHOULD pursue neuropsych eval.  I will have Beverlee Nims, my nurse call them. Thanks for the notification.  sa  ----- Message -----    From: Jamey Ripa, PHD    Sent: 02/07/2015   3:28 PM      To: Star Age, MD  When contacted to schedule neuropsychological evaluation, patient's niece stated that you had told her that neuropsych. evaluation was no longer necessary. Just checking.  Legrand Como

## 2015-02-07 NOTE — Telephone Encounter (Signed)
Left message to call back  

## 2015-02-12 DIAGNOSIS — M6281 Muscle weakness (generalized): Secondary | ICD-10-CM | POA: Diagnosis not present

## 2015-02-12 DIAGNOSIS — R2689 Other abnormalities of gait and mobility: Secondary | ICD-10-CM | POA: Diagnosis not present

## 2015-02-12 DIAGNOSIS — M1990 Unspecified osteoarthritis, unspecified site: Secondary | ICD-10-CM | POA: Diagnosis not present

## 2015-02-12 DIAGNOSIS — I10 Essential (primary) hypertension: Secondary | ICD-10-CM | POA: Diagnosis not present

## 2015-02-12 DIAGNOSIS — F0390 Unspecified dementia without behavioral disturbance: Secondary | ICD-10-CM | POA: Diagnosis not present

## 2015-02-12 DIAGNOSIS — M419 Scoliosis, unspecified: Secondary | ICD-10-CM | POA: Diagnosis not present

## 2015-02-13 DIAGNOSIS — D485 Neoplasm of uncertain behavior of skin: Secondary | ICD-10-CM | POA: Diagnosis not present

## 2015-02-13 DIAGNOSIS — F458 Other somatoform disorders: Secondary | ICD-10-CM | POA: Diagnosis not present

## 2015-02-13 DIAGNOSIS — L820 Inflamed seborrheic keratosis: Secondary | ICD-10-CM | POA: Diagnosis not present

## 2015-02-16 DIAGNOSIS — F0390 Unspecified dementia without behavioral disturbance: Secondary | ICD-10-CM | POA: Diagnosis not present

## 2015-02-16 DIAGNOSIS — M6281 Muscle weakness (generalized): Secondary | ICD-10-CM | POA: Diagnosis not present

## 2015-02-16 DIAGNOSIS — M1990 Unspecified osteoarthritis, unspecified site: Secondary | ICD-10-CM | POA: Diagnosis not present

## 2015-02-16 DIAGNOSIS — I10 Essential (primary) hypertension: Secondary | ICD-10-CM | POA: Diagnosis not present

## 2015-02-16 DIAGNOSIS — M419 Scoliosis, unspecified: Secondary | ICD-10-CM | POA: Diagnosis not present

## 2015-02-16 DIAGNOSIS — R2689 Other abnormalities of gait and mobility: Secondary | ICD-10-CM | POA: Diagnosis not present

## 2015-02-19 DIAGNOSIS — M419 Scoliosis, unspecified: Secondary | ICD-10-CM | POA: Diagnosis not present

## 2015-02-19 DIAGNOSIS — F0390 Unspecified dementia without behavioral disturbance: Secondary | ICD-10-CM | POA: Diagnosis not present

## 2015-02-19 DIAGNOSIS — M1990 Unspecified osteoarthritis, unspecified site: Secondary | ICD-10-CM | POA: Diagnosis not present

## 2015-02-19 DIAGNOSIS — R2689 Other abnormalities of gait and mobility: Secondary | ICD-10-CM | POA: Diagnosis not present

## 2015-02-19 DIAGNOSIS — M6281 Muscle weakness (generalized): Secondary | ICD-10-CM | POA: Diagnosis not present

## 2015-02-19 DIAGNOSIS — I10 Essential (primary) hypertension: Secondary | ICD-10-CM | POA: Diagnosis not present

## 2015-02-21 DIAGNOSIS — M1990 Unspecified osteoarthritis, unspecified site: Secondary | ICD-10-CM | POA: Diagnosis not present

## 2015-02-21 DIAGNOSIS — R2689 Other abnormalities of gait and mobility: Secondary | ICD-10-CM | POA: Diagnosis not present

## 2015-02-21 DIAGNOSIS — F0390 Unspecified dementia without behavioral disturbance: Secondary | ICD-10-CM | POA: Diagnosis not present

## 2015-02-21 DIAGNOSIS — M6281 Muscle weakness (generalized): Secondary | ICD-10-CM | POA: Diagnosis not present

## 2015-02-21 DIAGNOSIS — I10 Essential (primary) hypertension: Secondary | ICD-10-CM | POA: Diagnosis not present

## 2015-02-21 DIAGNOSIS — M419 Scoliosis, unspecified: Secondary | ICD-10-CM | POA: Diagnosis not present

## 2015-02-21 NOTE — Telephone Encounter (Signed)
I spoke to niece. She states that they are aware that Dr. Rexene Alberts recommends neuropsych test but do not want to pursue. They feel that patient cannot tolerate further testing at this time.

## 2015-02-23 DIAGNOSIS — M1990 Unspecified osteoarthritis, unspecified site: Secondary | ICD-10-CM | POA: Diagnosis not present

## 2015-02-23 DIAGNOSIS — R2689 Other abnormalities of gait and mobility: Secondary | ICD-10-CM | POA: Diagnosis not present

## 2015-02-23 DIAGNOSIS — M419 Scoliosis, unspecified: Secondary | ICD-10-CM | POA: Diagnosis not present

## 2015-02-23 DIAGNOSIS — M6281 Muscle weakness (generalized): Secondary | ICD-10-CM | POA: Diagnosis not present

## 2015-02-23 DIAGNOSIS — F0390 Unspecified dementia without behavioral disturbance: Secondary | ICD-10-CM | POA: Diagnosis not present

## 2015-02-23 DIAGNOSIS — I10 Essential (primary) hypertension: Secondary | ICD-10-CM | POA: Diagnosis not present

## 2015-02-26 DIAGNOSIS — I10 Essential (primary) hypertension: Secondary | ICD-10-CM | POA: Diagnosis not present

## 2015-02-26 DIAGNOSIS — M419 Scoliosis, unspecified: Secondary | ICD-10-CM | POA: Diagnosis not present

## 2015-02-26 DIAGNOSIS — M6281 Muscle weakness (generalized): Secondary | ICD-10-CM | POA: Diagnosis not present

## 2015-02-26 DIAGNOSIS — R2689 Other abnormalities of gait and mobility: Secondary | ICD-10-CM | POA: Diagnosis not present

## 2015-02-26 DIAGNOSIS — M1990 Unspecified osteoarthritis, unspecified site: Secondary | ICD-10-CM | POA: Diagnosis not present

## 2015-02-26 DIAGNOSIS — F0390 Unspecified dementia without behavioral disturbance: Secondary | ICD-10-CM | POA: Diagnosis not present

## 2015-02-27 DIAGNOSIS — I10 Essential (primary) hypertension: Secondary | ICD-10-CM | POA: Diagnosis not present

## 2015-02-27 DIAGNOSIS — R2689 Other abnormalities of gait and mobility: Secondary | ICD-10-CM | POA: Diagnosis not present

## 2015-02-27 DIAGNOSIS — M6281 Muscle weakness (generalized): Secondary | ICD-10-CM | POA: Diagnosis not present

## 2015-02-27 DIAGNOSIS — M1990 Unspecified osteoarthritis, unspecified site: Secondary | ICD-10-CM | POA: Diagnosis not present

## 2015-02-27 DIAGNOSIS — M419 Scoliosis, unspecified: Secondary | ICD-10-CM | POA: Diagnosis not present

## 2015-02-27 DIAGNOSIS — F0390 Unspecified dementia without behavioral disturbance: Secondary | ICD-10-CM | POA: Diagnosis not present

## 2015-02-28 ENCOUNTER — Encounter (INDEPENDENT_AMBULATORY_CARE_PROVIDER_SITE_OTHER): Payer: Medicare Other | Admitting: Ophthalmology

## 2015-02-28 DIAGNOSIS — H35033 Hypertensive retinopathy, bilateral: Secondary | ICD-10-CM

## 2015-02-28 DIAGNOSIS — H43813 Vitreous degeneration, bilateral: Secondary | ICD-10-CM

## 2015-02-28 DIAGNOSIS — I10 Essential (primary) hypertension: Secondary | ICD-10-CM

## 2015-02-28 DIAGNOSIS — H353231 Exudative age-related macular degeneration, bilateral, with active choroidal neovascularization: Secondary | ICD-10-CM

## 2015-04-23 ENCOUNTER — Emergency Department (HOSPITAL_COMMUNITY): Payer: Medicare Other

## 2015-04-23 ENCOUNTER — Encounter (HOSPITAL_COMMUNITY): Payer: Self-pay | Admitting: Emergency Medicine

## 2015-04-23 ENCOUNTER — Emergency Department (HOSPITAL_COMMUNITY)
Admission: EM | Admit: 2015-04-23 | Discharge: 2015-04-23 | Disposition: A | Discharge status: Home / Self Care | Payer: Medicare Other | Attending: Emergency Medicine | Admitting: Emergency Medicine

## 2015-04-23 ENCOUNTER — Emergency Department (INDEPENDENT_AMBULATORY_CARE_PROVIDER_SITE_OTHER)
Admission: EM | Admit: 2015-04-23 | Discharge: 2015-04-23 | Disposition: A | Discharge status: Nursing Home (Includes ICF) | Payer: Medicare Other | Source: Home / Self Care

## 2015-04-23 DIAGNOSIS — S51812A Laceration without foreign body of left forearm, initial encounter: Secondary | ICD-10-CM | POA: Insufficient documentation

## 2015-04-23 DIAGNOSIS — Z8673 Personal history of transient ischemic attack (TIA), and cerebral infarction without residual deficits: Secondary | ICD-10-CM | POA: Insufficient documentation

## 2015-04-23 DIAGNOSIS — W108XXA Fall (on) (from) other stairs and steps, initial encounter: Secondary | ICD-10-CM | POA: Diagnosis not present

## 2015-04-23 DIAGNOSIS — Z8742 Personal history of other diseases of the female genital tract: Secondary | ICD-10-CM | POA: Insufficient documentation

## 2015-04-23 DIAGNOSIS — S0101XA Laceration without foreign body of scalp, initial encounter: Secondary | ICD-10-CM | POA: Insufficient documentation

## 2015-04-23 DIAGNOSIS — Z880 Allergy status to penicillin: Secondary | ICD-10-CM | POA: Diagnosis not present

## 2015-04-23 DIAGNOSIS — Y9301 Activity, walking, marching and hiking: Secondary | ICD-10-CM | POA: Diagnosis not present

## 2015-04-23 DIAGNOSIS — Y9289 Other specified places as the place of occurrence of the external cause: Secondary | ICD-10-CM | POA: Diagnosis not present

## 2015-04-23 DIAGNOSIS — S0191XA Laceration without foreign body of unspecified part of head, initial encounter: Secondary | ICD-10-CM

## 2015-04-23 DIAGNOSIS — Z8669 Personal history of other diseases of the nervous system and sense organs: Secondary | ICD-10-CM | POA: Diagnosis not present

## 2015-04-23 DIAGNOSIS — Z8739 Personal history of other diseases of the musculoskeletal system and connective tissue: Secondary | ICD-10-CM | POA: Diagnosis not present

## 2015-04-23 DIAGNOSIS — Z8639 Personal history of other endocrine, nutritional and metabolic disease: Secondary | ICD-10-CM | POA: Diagnosis not present

## 2015-04-23 DIAGNOSIS — Z79899 Other long term (current) drug therapy: Secondary | ICD-10-CM | POA: Insufficient documentation

## 2015-04-23 DIAGNOSIS — Z8619 Personal history of other infectious and parasitic diseases: Secondary | ICD-10-CM | POA: Insufficient documentation

## 2015-04-23 DIAGNOSIS — S0990XA Unspecified injury of head, initial encounter: Secondary | ICD-10-CM

## 2015-04-23 DIAGNOSIS — S299XXA Unspecified injury of thorax, initial encounter: Secondary | ICD-10-CM | POA: Diagnosis not present

## 2015-04-23 DIAGNOSIS — S0180XA Unspecified open wound of other part of head, initial encounter: Secondary | ICD-10-CM | POA: Diagnosis not present

## 2015-04-23 DIAGNOSIS — Z0389 Encounter for observation for other suspected diseases and conditions ruled out: Secondary | ICD-10-CM | POA: Diagnosis not present

## 2015-04-23 DIAGNOSIS — W19XXXA Unspecified fall, initial encounter: Secondary | ICD-10-CM

## 2015-04-23 DIAGNOSIS — Z23 Encounter for immunization: Secondary | ICD-10-CM | POA: Insufficient documentation

## 2015-04-23 DIAGNOSIS — S59912A Unspecified injury of left forearm, initial encounter: Secondary | ICD-10-CM | POA: Diagnosis not present

## 2015-04-23 DIAGNOSIS — Y998 Other external cause status: Secondary | ICD-10-CM | POA: Insufficient documentation

## 2015-04-23 DIAGNOSIS — T148 Other injury of unspecified body region: Secondary | ICD-10-CM | POA: Diagnosis not present

## 2015-04-23 DIAGNOSIS — M79632 Pain in left forearm: Secondary | ICD-10-CM | POA: Diagnosis not present

## 2015-04-23 DIAGNOSIS — Z8719 Personal history of other diseases of the digestive system: Secondary | ICD-10-CM | POA: Diagnosis not present

## 2015-04-23 DIAGNOSIS — S199XXA Unspecified injury of neck, initial encounter: Secondary | ICD-10-CM | POA: Diagnosis not present

## 2015-04-23 LAB — CBC WITH DIFFERENTIAL/PLATELET
Basophils Absolute: 0 10*3/uL (ref 0.0–0.1)
Basophils Relative: 0 %
EOS ABS: 0 10*3/uL (ref 0.0–0.7)
Eosinophils Relative: 0 %
HCT: 45.8 % (ref 36.0–46.0)
Hemoglobin: 14.9 g/dL (ref 12.0–15.0)
LYMPHS PCT: 16 %
Lymphs Abs: 1.4 10*3/uL (ref 0.7–4.0)
MCH: 29.7 pg (ref 26.0–34.0)
MCHC: 32.5 g/dL (ref 30.0–36.0)
MCV: 91.4 fL (ref 78.0–100.0)
Monocytes Absolute: 0.7 10*3/uL (ref 0.1–1.0)
Monocytes Relative: 8 %
NEUTROS PCT: 76 %
Neutro Abs: 6.5 10*3/uL (ref 1.7–7.7)
Platelets: 237 10*3/uL (ref 150–400)
RBC: 5.01 MIL/uL (ref 3.87–5.11)
RDW: 14 % (ref 11.5–15.5)
WBC: 8.6 10*3/uL (ref 4.0–10.5)

## 2015-04-23 LAB — URINALYSIS, ROUTINE W REFLEX MICROSCOPIC
Bilirubin Urine: NEGATIVE
Glucose, UA: NEGATIVE mg/dL
Hgb urine dipstick: NEGATIVE
Ketones, ur: 40 mg/dL — AB
LEUKOCYTES UA: NEGATIVE
Nitrite: NEGATIVE
PROTEIN: NEGATIVE mg/dL
SPECIFIC GRAVITY, URINE: 1.015 (ref 1.005–1.030)
pH: 7.5 (ref 5.0–8.0)

## 2015-04-23 LAB — BASIC METABOLIC PANEL
ANION GAP: 12 (ref 5–15)
BUN: 20 mg/dL (ref 6–20)
CO2: 25 mmol/L (ref 22–32)
Calcium: 9.2 mg/dL (ref 8.9–10.3)
Chloride: 107 mmol/L (ref 101–111)
Creatinine, Ser: 1.12 mg/dL — ABNORMAL HIGH (ref 0.44–1.00)
GFR calc Af Amer: 50 mL/min — ABNORMAL LOW (ref >60)
GFR calc non Af Amer: 43 mL/min — ABNORMAL LOW (ref >60)
Glucose, Bld: 96 mg/dL (ref 65–99)
POTASSIUM: 4.4 mmol/L (ref 3.5–5.1)
SODIUM: 144 mmol/L (ref 135–145)

## 2015-04-23 LAB — PROTIME-INR
INR: 1.01 (ref 0.00–1.49)
PROTHROMBIN TIME: 13.5 s (ref 11.6–15.2)

## 2015-04-23 LAB — APTT: APTT: 27 s (ref 24–37)

## 2015-04-23 MED ORDER — TETANUS-DIPHTH-ACELL PERTUSSIS 5-2.5-18.5 LF-MCG/0.5 IM SUSP
0.5000 mL | Freq: Once | INTRAMUSCULAR | Status: AC
  Administered 2015-04-23: 0.5 mL via INTRAMUSCULAR
  Filled 2015-04-23: qty 0.5

## 2015-04-23 MED ORDER — BUPIVACAINE HCL (PF) 0.5 % IJ SOLN
10.0000 mL | Freq: Once | INTRAMUSCULAR | Status: AC
  Administered 2015-04-23: 10 mL
  Filled 2015-04-23: qty 10

## 2015-04-23 NOTE — Discharge Instructions (Signed)
Nonsutured Laceration Care A laceration is a cut that goes through all layers of the skin and extends into the tissue that is right under the skin. This type of cut is usually stitched up (sutured or stapled) or closed with tape (adhesive strips) or skin glue shortly after the injury happens. However, if the wound is dirty or if several hours pass before medical treatment is provided, it is likely that germs (bacteria) will enter the wound. Closing a laceration after bacteria have entered it increases the risk of infection. In these cases, your health care provider may leave the laceration open (nonsutured) and cover it with a bandage. This type of treatment helps prevent infection and allows the wound to heal from the deepest layer of tissue damage up to the surface. An open fracture is a type of injury that may involve nonsutured lacerations. An open fracture is a break in a bone that happens along with one or more lacerations through the skin that is near the fracture site. HOW TO CARE FOR YOUR NONSUTURED LACERATION  Take or apply over-the-counter and prescription medicines only as told by your health care provider.  If you were prescribed an antibiotic medicine, take or apply it as told by your health care provider. Do not stop using the antibiotic even if your condition improves.  Clean the wound one time each day or as told by your health care provider.  Wash the wound with mild soap and water.  Rinse the wound with water to remove all soap.  Pat your wound dry with a clean towel. Do not rub the wound.  Do not inject anything into the wound unless your health care provider told you to.  Change any bandages (dressings) as told by your health care provider. This includes changing the dressing if it gets wet, dirty, or starts to smell bad.  Keep the dressing dry until your health care provider says it can be removed. Do not take baths, swim, or do anything that puts your wound underwater  until your health care provider approves.  Raise (elevate) the injured area above the level of your heart while you are sitting or lying down, if possible.  Do not scratch or pick at the wound.  Check your wound every day for signs of infection. Watch for:  Redness, swelling, or pain.  Fluid, blood, or pus.  Keep all follow-up visits as told by your health care provider. This is important. SEEK MEDICAL CARE IF:  You received a tetanus and shot and you have swelling, severe pain, redness, or bleeding at the injection site.   You have a fever.  Your pain is not controlled with medicine.  You have increased redness, swelling, or pain at the site of your wound.  You have fluid, blood, or pus coming from your wound.  You notice a bad smell coming from your wound or your dressing.  You notice something coming out of the wound, such as wood or glass.  You notice a change in the color of your skin near your wound.  You develop a new rash.  You need to change the dressing frequently due to fluid, blood, or pus draining from the wound.  You develop numbness around your wound. SEEK IMMEDIATE MEDICAL CARE IF:  Your pain suddenly increases and is severe.  You develop severe swelling around the wound.  The wound is on your hand or foot and you cannot properly move a finger or toe.  The wound is on your  hand or foot and you notice that your fingers or toes look pale or bluish.  You have a red streak going away from your wound.   This information is not intended to replace advice given to you by your health care provider. Make sure you discuss any questions you have with your health care provider.   Document Released: 03/05/2006 Document Revised: 08/22/2014 Document Reviewed: 04/03/2014 Elsevier Interactive Patient Education [employer].

## 2015-04-23 NOTE — ED Provider Notes (Signed)
CSN: [id number]     Arrival date & time 04/23/15  1524 History   None  Chief complaint: fall, head injury     HPI   Patient is an 82 year old lady who presents after falling today. She has an abrasion on her left arm, and a laceration on the posterior scalp. She's complaining of a bad headache and decreased visual acuity. Her caregiver says that she is not able to help her transferring in and out of the vehicle. The patient is talkative, reclining on the stretcher, and appears to be moving all extremities.     Past Medical History  Diagnosis Date  . Gilbert disease   . Atrophic vaginitis   . Vascular headache   . Mini stroke ([place])   . Stroke ([place]) 4/00  . Shingles 11/02  . Diverticulosis   . Vulvitis 2004  . Osteoporosis   . Hyperlipidemia    Past Surgical History  Procedure Laterality Date  . Cataract extraction  04/2003    bilateral   Family History  Problem Relation Age of Onset  . Hypertension Mother   . Hypertension Father   . Heart disease Father   . Breast cancer Sister   . Alzheimer's disease Brother    Social History  Substance Use Topics  . Smoking status: Never Smoker   . Smokeless tobacco: None  . Alcohol Use: No   OB History    Gravida Para Term Preterm AB TAB SAB Ectopic Multiple Living   0 0 0 0 0 0 0 0 0 0      Review of Systems  Allergies  Prolia and Penicillins  Home Medications   Prior to Admission medications   Medication Sig Start Date End Date Taking? Authorizing Provider  NAMENDA XR 28 MG CP24 24 hr capsule  01/09/15   Historical Provider, MD  traZODone (DESYREL) 100 MG tablet Take 100 mg by mouth at bedtime. 12/29/14   Historical Provider, MD    BP 146/94 mmHg  Pulse 74  Resp 16  SpO2 97%   Physical Exam  HENT:  Laceration and blood at the left back of the head Abrasion/skin tear on the left forearm Alert and conversational, complaining of bad headache    ED Course  Procedures (including critical care time)  none  MDM    1. Fall, initial encounter   2. Head injury due to trauma, initial encounter    patient caregiver reports that she is not able to assist patient with transferring in and out of a vehicle.  She'll be transported by rescue squad to the emergency room for further evaluation of headache/head injury after fall.    Sherlene Shams, MD 04/23/15 [phone]

## 2015-04-23 NOTE — ED Notes (Signed)
Pt. Left with all belongings. Discharge instructions were reviewed and all questions were answered.  

## 2015-04-23 NOTE — ED Notes (Signed)
Per EMS: pt tx from [facility] for eval of posterior head laceration following fall today, pt states she tripped while walking up the stairs and hit her head on the steps. Pt denies any LOC, pt denies taking any blood thinners. Laceration noted with minimal bleeding to posterior head, skin tear to left and right forearm. Pt crying and anxious stating head hurts. Caregiver at bedside, axox 4.

## 2015-04-23 NOTE — ED Provider Notes (Signed)
CSN: [id number]     Arrival date & time 04/23/15  1623 History   First MD Initiated Contact with Patient 04/23/15 1634     Chief Complaint  Patient presents with  . Fall  . Head Injury   69 young Caucasian female with PMH of osteoporosis and hyperlipidemia who presents after mechanical fall striking the back of her head on the steps. This occurred approximately less than 1 hour ago. Patient does not feel presyncopal, chest pain, fevers, chills, chest pain or any other illness prior to falling. Says she thinks she missed a step and didn't lift her foot high enough. Now has a posterior headache. Denies any numbness tingling or weakness in any of her extremities. No vision changes.  (Consider location/radiation/quality/duration/timing/severity/associated sxs/prior Treatment) Patient is a 82 y.o. female presenting with fall.  Fall This is a new problem. The current episode started today. Associated symptoms include headaches (back of head). Pertinent negatives include no abdominal pain, chest pain, chills, fever, nausea, vomiting or weakness. Associated symptoms comments: Tripped up steps. Nothing aggravates the symptoms. She has tried nothing for the symptoms. The treatment provided no relief.    Past Medical History  Diagnosis Date  . Gilbert disease   . Atrophic vaginitis   . Vascular headache   . Mini stroke ([place])   . Stroke ([place]) 4/00  . Shingles 11/02  . Diverticulosis   . Vulvitis 2004  . Osteoporosis   . Hyperlipidemia    Past Surgical History  Procedure Laterality Date  . Cataract extraction  04/2003    bilateral   Family History  Problem Relation Age of Onset  . Hypertension Mother   . Hypertension Father   . Heart disease Father   . Breast cancer Sister   . Alzheimer's disease Brother    Social History  Substance Use Topics  . Smoking status: Never Smoker   . Smokeless tobacco: None  . Alcohol Use: No   OB History    Gravida Para Term Preterm AB TAB SAB Ectopic  Multiple Living   0 0 0 0 0 0 0 0 0 0      Review of Systems  Constitutional: Negative for fever and chills.  Respiratory: Negative for shortness of breath.   Cardiovascular: Negative for chest pain, palpitations and leg swelling.  Gastrointestinal: Negative for nausea, vomiting, abdominal pain, diarrhea, constipation and abdominal distention.  Genitourinary: Negative for dysuria, frequency, flank pain and decreased urine volume.  Neurological: Positive for headaches (back of head). Negative for dizziness, speech difficulty, weakness and light-headedness.  All other systems reviewed and are negative.     Allergies  Prolia and Penicillins  Home Medications   Prior to Admission medications   Medication Sig Start Date End Date Taking? Authorizing Provider  NAMENDA XR 28 MG CP24 24 hr capsule Take 28 mg by mouth at bedtime.  01/09/15  Yes Historical Provider, MD  traZODone (DESYREL) 100 MG tablet Take 100 mg by mouth at bedtime. 12/29/14  Yes Historical Provider, MD   BP 144/86 mmHg  Pulse 92  Temp(Src) 98.9 F (37.2 C) (Oral)  Resp 18  SpO2 94% Physical Exam  Constitutional: She is oriented to person, place, and time. She appears well-developed and well-nourished. No distress.  HENT:  Head: Normocephalic.  Small lac to left posterior scalp, bleeding controlled  Cardiovascular: Normal rate, regular rhythm, normal heart sounds and intact distal pulses.  Exam reveals no gallop and no friction rub.   No murmur heard. Pulmonary/Chest: Effort normal and breath  sounds normal. No respiratory distress. She has no wheezes. She has no rales. She exhibits no tenderness.  Abdominal: Soft. Bowel sounds are normal. She exhibits no distension and no mass. There is no tenderness. There is no rebound and no guarding.  Musculoskeletal: Normal range of motion. She exhibits no edema or tenderness.  Lymphadenopathy:    She has no cervical adenopathy.  Neurological: She is alert and oriented to person,  place, and time. No cranial nerve deficit. Coordination normal.  Repeating self  Skin: Skin is warm and dry. She is not diaphoretic.  Skin tear to left forearm/wrist.  Nursing note and vitals reviewed.   ED Course  .Marland KitchenLaceration Repair Date/Time: 04/23/2015 8:27 PM Performed by: Sherian Maroon Authorized by: Sherian Maroon Consent: Verbal consent obtained. Patient identity confirmed: verbally with patient Body area: head/neck Location details: scalp Laceration length: 1 cm Tendon involvement: none Nerve involvement: none Irrigation solution: saline Irrigation method: tap Amount of cleaning: standard Debridement: none Degree of undermining: none Number of sutures: 2 staples. Patient tolerance: Patient tolerated the procedure well with no immediate complications   (including critical care time) Labs Review Labs Reviewed  BASIC METABOLIC PANEL - Abnormal; Notable for the following:    Creatinine, Ser 1.12 (*)    GFR calc non Af Amer 43 (*)    GFR calc Af Amer 50 (*)    All other components within normal limits  URINE CULTURE  CBC WITH DIFFERENTIAL/PLATELET  PROTIME-INR  APTT  URINALYSIS, ROUTINE W REFLEX MICROSCOPIC (NOT AT [facility])    Imaging Review Dg Chest 2 View  04/23/2015  CLINICAL DATA:  Altered mental status after fall today EXAM: CHEST  2 VIEW COMPARISON:  01/31/2014 FINDINGS: Lungs are hyperinflated. Heart size is normal. There are no focal consolidations or pleural effusions. Lungs are hyperinflated. Degenerative changes are seen in the thoracic spine. Chronic changes identified in the shoulders bilaterally, right greater than left. IMPRESSION: 1. Hyperinflation. 2.  No evidence for acute  abnormality. Electronically Signed   By: Nolon Nations M.D.   On: 04/23/2015 19:23   Dg Forearm Left  04/23/2015  CLINICAL DATA:  Fall and pain. EXAM: LEFT FOREARM - 2 VIEW COMPARISON:  Wrist films of 12/06/2014 FINDINGS: Remote olecranon fixation. Osteopenia. No acute hardware  complication. No acute fracture or dislocation. IMPRESSION: No acute osseous abnormality. Electronically Signed   By: Abigail Miyamoto M.D.   On: 04/23/2015 18:32   Ct Head Wo Contrast  04/23/2015  CLINICAL DATA:  [place] today hitting back of head. No reported loss of consciousness. EXAM: CT HEAD WITHOUT CONTRAST CT CERVICAL SPINE WITHOUT CONTRAST TECHNIQUE: Multidetector CT imaging of the head and cervical spine was performed following the standard protocol without intravenous contrast. Multiplanar CT image reconstructions of the cervical spine were also generated. COMPARISON:  CT head 05/13/2011. FINDINGS: CT HEAD FINDINGS No evidence for acute infarction, hemorrhage, mass lesion, hydrocephalus, or extra-axial fluid. Generalized atrophy with chronic microvascular ischemic change. LEFT parietal scalp hematoma without underlying skull fracture. No contrecoup injury. No sinus or mastoid air fluid level. Except for the scalp hematoma, similar appearance to priors. CT CERVICAL SPINE FINDINGS There is no visible cervical spine fracture, traumatic subluxation, prevertebral soft tissue swelling, or intraspinal hematoma. Multilevel disc space narrowing worst at C4-C5. Trace anterolisthesis at C3-4, C4-5, and C6-7 related to facet disease. Severe facet arthropathy most pronounced at C4-5 on the LEFT. No lung apex disease of significance. No neck masses. Atherosclerosis. IMPRESSION: No cervical spine fracture or traumatic subluxation. No skull fracture  or intracranial hemorrhage. LEFT parietal scalp hematoma. Atrophy and small vessel disease. Electronically Signed   By: Staci Righter M.D.   On: 04/23/2015 19:07   Ct Cervical Spine Wo Contrast  04/23/2015  CLINICAL DATA:  [place] today hitting back of head. No reported loss of consciousness. EXAM: CT HEAD WITHOUT CONTRAST CT CERVICAL SPINE WITHOUT CONTRAST TECHNIQUE: Multidetector CT imaging of the head and cervical spine was performed following the standard protocol without  intravenous contrast. Multiplanar CT image reconstructions of the cervical spine were also generated. COMPARISON:  CT head 05/13/2011. FINDINGS: CT HEAD FINDINGS No evidence for acute infarction, hemorrhage, mass lesion, hydrocephalus, or extra-axial fluid. Generalized atrophy with chronic microvascular ischemic change. LEFT parietal scalp hematoma without underlying skull fracture. No contrecoup injury. No sinus or mastoid air fluid level. Except for the scalp hematoma, similar appearance to priors. CT CERVICAL SPINE FINDINGS There is no visible cervical spine fracture, traumatic subluxation, prevertebral soft tissue swelling, or intraspinal hematoma. Multilevel disc space narrowing worst at C4-C5. Trace anterolisthesis at C3-4, C4-5, and C6-7 related to facet disease. Severe facet arthropathy most pronounced at C4-5 on the LEFT. No lung apex disease of significance. No neck masses. Atherosclerosis. IMPRESSION: No cervical spine fracture or traumatic subluxation. No skull fracture or intracranial hemorrhage. LEFT parietal scalp hematoma. Atrophy and small vessel disease. Electronically Signed   By: Staci Righter M.D.   On: 04/23/2015 19:07   I have personally reviewed and evaluated these images and lab results as part of my medical decision-making.   EKG Interpretation   Date/Time:  Monday April 23 2015 17:02:39 EST Ventricular Rate:  81 PR Interval:  155 QRS Duration: 91 QT Interval:  387 QTC Calculation: 449 R Axis:   52 Text Interpretation:  Sinus rhythm Borderline low voltage, extremity leads  Confirmed by [facility] MD, Corene Cornea [phone]) on 04/23/2015 6:12:53 PM      MDM   Final diagnoses:  Fall  Laceration of head, initial encounter   82 year old Caucasian female here after mechanical fall. See history of present illness for details. On exam NAD, afebrile, vital signs stable. Has skin tears to the left forearm and one inch laceration to the posterior skull. Cranial nerves intact and no  deficits on neuro exam. Physical exam otherwise benign. We'll obtain CAT scan of head and C-spine. Of note patient had a left forearm fracture that was healing. Given fall today will reimage the patient has no tenderness here. Tdap updated.  No traumatic injuries. Pt remains well appearing. Lac repaired as above. DC home w/FU to PCP for staple removal and wound check in 1 week. Strict return if AMS, or severe HA or focal neural deficits.   Pt was seen under the supervision of Dr. Dayna Barker.     Sherian Maroon, MD 04/23/15 [id number]  Merrily Pew, MD 04/26/15 [phone]

## 2015-04-23 NOTE — ED Notes (Signed)
Spoke with caregiver at bedside in regards to pt repetative questioning and mild confusion, caregiver states pt is at her baseline.

## 2015-04-23 NOTE — ED Notes (Addendum)
Patient reports she fell outside her house on brick stairs and hit her head and left forearm. She has bleeding in her hair but no visible open wound. Patient has an open wound on left forearm. Patient is complaining of blurred vision and pain. Reports she just broke her left shoulder 2 weeks ago. She seems mildly confused and repeating a lot of questions. She has a Actuary with her.

## 2015-04-23 NOTE — ED Notes (Signed)
Used BSC to help pt. To the bathroom. This RN and Katie EMT assisted pt. And pt was very unsteady with transfer. Pt fell backward onto bedside commode with no injury sustained. Pt then stood up and fell forward onto bed with no injury sustain. Both "falls" were assisted and pt never hit the floor

## 2015-04-24 LAB — URINE CULTURE

## 2015-05-02 DIAGNOSIS — R2689 Other abnormalities of gait and mobility: Secondary | ICD-10-CM | POA: Diagnosis not present

## 2015-05-02 DIAGNOSIS — T148 Other injury of unspecified body region: Secondary | ICD-10-CM | POA: Diagnosis not present

## 2015-05-09 DIAGNOSIS — M1990 Unspecified osteoarthritis, unspecified site: Secondary | ICD-10-CM | POA: Diagnosis not present

## 2015-05-09 DIAGNOSIS — F419 Anxiety disorder, unspecified: Secondary | ICD-10-CM | POA: Diagnosis not present

## 2015-05-09 DIAGNOSIS — M47817 Spondylosis without myelopathy or radiculopathy, lumbosacral region: Secondary | ICD-10-CM | POA: Diagnosis not present

## 2015-05-09 DIAGNOSIS — R531 Weakness: Secondary | ICD-10-CM | POA: Diagnosis not present

## 2015-05-09 DIAGNOSIS — I739 Peripheral vascular disease, unspecified: Secondary | ICD-10-CM | POA: Diagnosis not present

## 2015-05-09 DIAGNOSIS — R413 Other amnesia: Secondary | ICD-10-CM | POA: Diagnosis not present

## 2015-05-11 DIAGNOSIS — F419 Anxiety disorder, unspecified: Secondary | ICD-10-CM | POA: Diagnosis not present

## 2015-05-11 DIAGNOSIS — I739 Peripheral vascular disease, unspecified: Secondary | ICD-10-CM | POA: Diagnosis not present

## 2015-05-11 DIAGNOSIS — M47817 Spondylosis without myelopathy or radiculopathy, lumbosacral region: Secondary | ICD-10-CM | POA: Diagnosis not present

## 2015-05-11 DIAGNOSIS — R531 Weakness: Secondary | ICD-10-CM | POA: Diagnosis not present

## 2015-05-11 DIAGNOSIS — R413 Other amnesia: Secondary | ICD-10-CM | POA: Diagnosis not present

## 2015-05-11 DIAGNOSIS — M1990 Unspecified osteoarthritis, unspecified site: Secondary | ICD-10-CM | POA: Diagnosis not present

## 2015-05-16 DIAGNOSIS — F419 Anxiety disorder, unspecified: Secondary | ICD-10-CM | POA: Diagnosis not present

## 2015-05-16 DIAGNOSIS — R413 Other amnesia: Secondary | ICD-10-CM | POA: Diagnosis not present

## 2015-05-16 DIAGNOSIS — M1990 Unspecified osteoarthritis, unspecified site: Secondary | ICD-10-CM | POA: Diagnosis not present

## 2015-05-16 DIAGNOSIS — M47817 Spondylosis without myelopathy or radiculopathy, lumbosacral region: Secondary | ICD-10-CM | POA: Diagnosis not present

## 2015-05-16 DIAGNOSIS — R531 Weakness: Secondary | ICD-10-CM | POA: Diagnosis not present

## 2015-05-16 DIAGNOSIS — I739 Peripheral vascular disease, unspecified: Secondary | ICD-10-CM | POA: Diagnosis not present

## 2015-05-17 DIAGNOSIS — I739 Peripheral vascular disease, unspecified: Secondary | ICD-10-CM | POA: Diagnosis not present

## 2015-05-17 DIAGNOSIS — F419 Anxiety disorder, unspecified: Secondary | ICD-10-CM | POA: Diagnosis not present

## 2015-05-17 DIAGNOSIS — R413 Other amnesia: Secondary | ICD-10-CM | POA: Diagnosis not present

## 2015-05-17 DIAGNOSIS — R531 Weakness: Secondary | ICD-10-CM | POA: Diagnosis not present

## 2015-05-17 DIAGNOSIS — M1990 Unspecified osteoarthritis, unspecified site: Secondary | ICD-10-CM | POA: Diagnosis not present

## 2015-05-17 DIAGNOSIS — M47817 Spondylosis without myelopathy or radiculopathy, lumbosacral region: Secondary | ICD-10-CM | POA: Diagnosis not present

## 2015-05-22 DIAGNOSIS — M1990 Unspecified osteoarthritis, unspecified site: Secondary | ICD-10-CM | POA: Diagnosis not present

## 2015-05-22 DIAGNOSIS — R531 Weakness: Secondary | ICD-10-CM | POA: Diagnosis not present

## 2015-05-22 DIAGNOSIS — I739 Peripheral vascular disease, unspecified: Secondary | ICD-10-CM | POA: Diagnosis not present

## 2015-05-22 DIAGNOSIS — F419 Anxiety disorder, unspecified: Secondary | ICD-10-CM | POA: Diagnosis not present

## 2015-05-22 DIAGNOSIS — R413 Other amnesia: Secondary | ICD-10-CM | POA: Diagnosis not present

## 2015-05-22 DIAGNOSIS — M47817 Spondylosis without myelopathy or radiculopathy, lumbosacral region: Secondary | ICD-10-CM | POA: Diagnosis not present

## 2015-05-24 DIAGNOSIS — I739 Peripheral vascular disease, unspecified: Secondary | ICD-10-CM | POA: Diagnosis not present

## 2015-05-24 DIAGNOSIS — M1990 Unspecified osteoarthritis, unspecified site: Secondary | ICD-10-CM | POA: Diagnosis not present

## 2015-05-24 DIAGNOSIS — F419 Anxiety disorder, unspecified: Secondary | ICD-10-CM | POA: Diagnosis not present

## 2015-05-24 DIAGNOSIS — M47817 Spondylosis without myelopathy or radiculopathy, lumbosacral region: Secondary | ICD-10-CM | POA: Diagnosis not present

## 2015-05-24 DIAGNOSIS — R531 Weakness: Secondary | ICD-10-CM | POA: Diagnosis not present

## 2015-05-24 DIAGNOSIS — R413 Other amnesia: Secondary | ICD-10-CM | POA: Diagnosis not present

## 2015-05-31 ENCOUNTER — Ambulatory Visit: Payer: Medicare Other | Admitting: Neurology

## 2015-06-01 ENCOUNTER — Encounter: Payer: Self-pay | Admitting: Neurology

## 2015-06-11 DIAGNOSIS — R413 Other amnesia: Secondary | ICD-10-CM | POA: Diagnosis not present

## 2015-06-11 DIAGNOSIS — R269 Unspecified abnormalities of gait and mobility: Secondary | ICD-10-CM | POA: Diagnosis not present

## 2015-06-11 DIAGNOSIS — F329 Major depressive disorder, single episode, unspecified: Secondary | ICD-10-CM | POA: Diagnosis not present

## 2015-06-20 ENCOUNTER — Encounter (INDEPENDENT_AMBULATORY_CARE_PROVIDER_SITE_OTHER): Payer: Medicare Other | Admitting: Ophthalmology

## 2015-06-26 DIAGNOSIS — I739 Peripheral vascular disease, unspecified: Secondary | ICD-10-CM | POA: Diagnosis not present

## 2015-06-26 DIAGNOSIS — R531 Weakness: Secondary | ICD-10-CM | POA: Diagnosis not present

## 2015-06-26 DIAGNOSIS — M1990 Unspecified osteoarthritis, unspecified site: Secondary | ICD-10-CM | POA: Diagnosis not present

## 2015-07-11 ENCOUNTER — Telehealth: Payer: Self-pay

## 2015-07-11 ENCOUNTER — Institutional Professional Consult (permissible substitution): Payer: Medicare Other | Admitting: Neurology

## 2015-07-11 NOTE — Telephone Encounter (Signed)
I called Dr. Pennie Banter office to leave a message.  Patient has no showed last 2 appointments which were re-referrals from Dr. Shelia Media. I advised that patient/family are non-compliant with treatment recommendations. I advised that patient may be dischargded from our office due to No Show policy. Staff reported that they will give message to Dr. Shelia Media.

## 2015-07-11 NOTE — Telephone Encounter (Signed)
Patient did not show to new referral appt.  

## 2015-07-16 DIAGNOSIS — Z961 Presence of intraocular lens: Secondary | ICD-10-CM | POA: Diagnosis not present

## 2015-07-16 DIAGNOSIS — H353113 Nonexudative age-related macular degeneration, right eye, advanced atrophic without subfoveal involvement: Secondary | ICD-10-CM | POA: Diagnosis not present

## 2015-07-17 ENCOUNTER — Telehealth: Payer: Self-pay | Admitting: Neurology

## 2015-07-17 ENCOUNTER — Encounter: Payer: Self-pay | Admitting: Neurology

## 2015-07-17 NOTE — Telephone Encounter (Signed)
Niece [name] [phone] called to find out how visit went with Dr. Rexene Alberts. [name] that patient missed her appointment on 07/11/15 (this was patient's 3rd no show appointment 07/11/15, 05/31/15, 03/28/14). Please advise if patient can reschedule appointment.

## 2015-07-17 NOTE — Telephone Encounter (Signed)
I am not sure how to handle this. [name] has turned down referral to Dr. Valentina Shaggy on behalf of [place] and patient did not show to last 2 appointments requested by Dr. Shelia Media.

## 2015-07-17 NOTE — Telephone Encounter (Signed)
I think at this point, it is probably best for Dr. Shelia Media to take care of the patient, as he also has all my recommendations and can help facilitate my recommendations and her care. Since she sees him on a more regular basis, I think it is probably easiest and logistically more feasible for her to continue to see him for her care, including the memory care.  Please call niece to refer to Dr. Shelia Media for further care and recommendations.

## 2015-07-18 NOTE — Telephone Encounter (Signed)
Recommend FU with Dr. Shelia Media as he has my recommendations and can facilitate appointment with Dr. Valentina Shaggy again.

## 2015-07-18 NOTE — Telephone Encounter (Signed)
Niece [name] [phone] returned Diana's call.

## 2015-07-18 NOTE — Telephone Encounter (Signed)
LM with recommendation below.

## 2015-07-18 NOTE — Telephone Encounter (Signed)
I called [name] back.  She states that the patient's memory loss is worse and she has started having some "behavioral issues". I explained that Dr. Shelia Media had requested 2 different appointments with [place] which we have called and scheduled with her [name]), Shaelynne had no showed both appointments. [name] states that she thought that [facility] called and rescheduled the last appointment. She stated that the patient has an aid who should be driving her to her appointments. [name] asked what direction they should head in and appeared interested in referral to Dr. Valentina Shaggy in light of behavior changes (Referral to Dr. Valentina Shaggy was declined by patient and niece last year). What do you recommend?

## 2015-07-19 NOTE — Telephone Encounter (Signed)
I spoke to [place] and she is aware of recommendations below and she voiced understanding.

## 2015-10-24 DIAGNOSIS — R21 Rash and other nonspecific skin eruption: Secondary | ICD-10-CM | POA: Diagnosis not present

## 2015-11-15 DIAGNOSIS — R300 Dysuria: Secondary | ICD-10-CM | POA: Diagnosis not present

## 2015-11-15 DIAGNOSIS — R319 Hematuria, unspecified: Secondary | ICD-10-CM | POA: Diagnosis not present

## 2015-11-17 ENCOUNTER — Other Ambulatory Visit: Payer: Self-pay | Admitting: Internal Medicine

## 2015-11-17 DIAGNOSIS — R109 Unspecified abdominal pain: Secondary | ICD-10-CM

## 2015-11-17 DIAGNOSIS — R319 Hematuria, unspecified: Secondary | ICD-10-CM

## 2015-11-17 DIAGNOSIS — R300 Dysuria: Secondary | ICD-10-CM

## 2016-01-10 IMAGING — DX DG ELBOW COMPLETE 3+V*L*
4 series · 4 of 4 positions shown · non-contrast
Comparison: None.

CLINICAL DATA: Tripped in driveway and landed on elbow. Left elbow
pain and swelling. Initial encounter.

EXAM:
LEFT ELBOW - COMPLETE 3+ VIEW

[elbow ap]
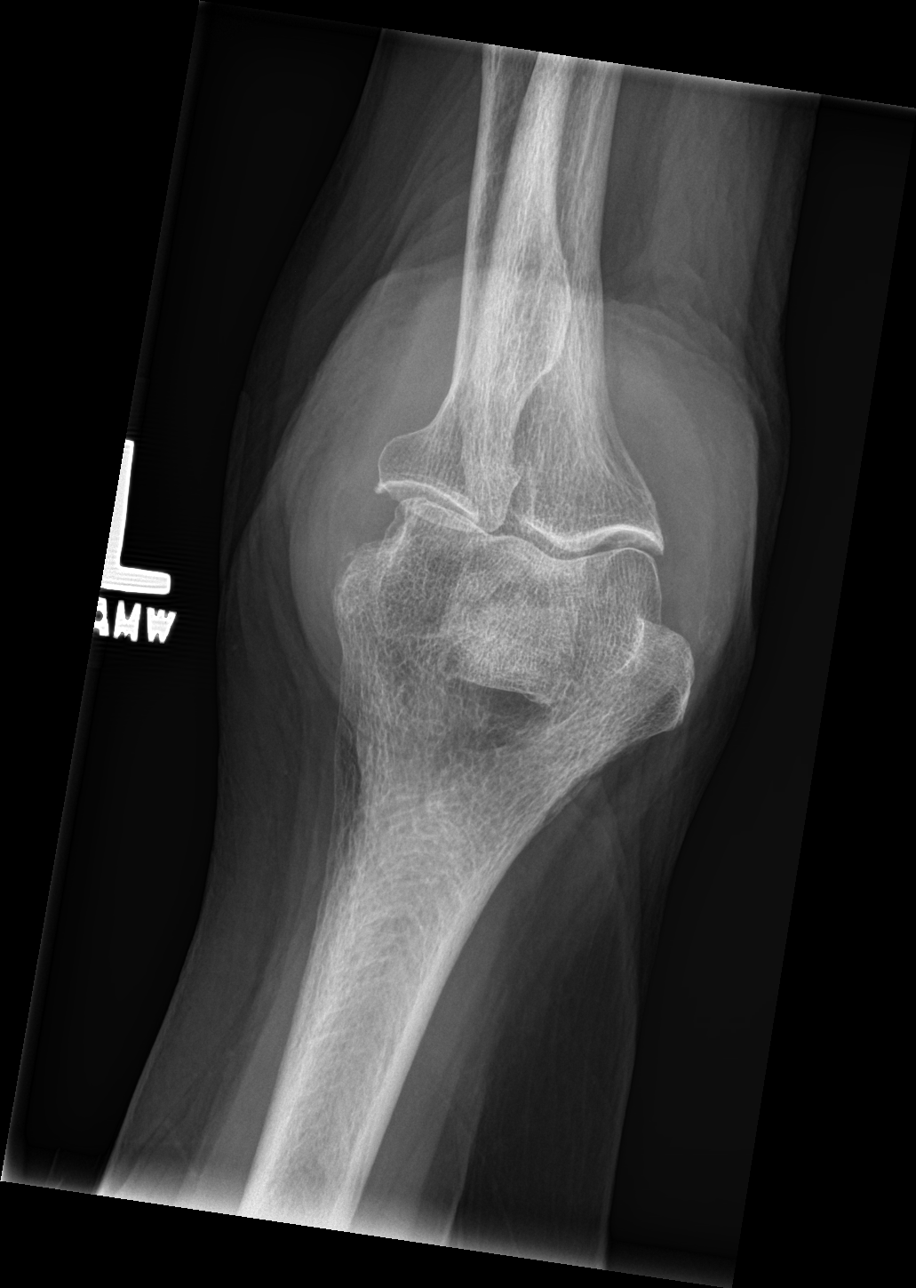

[elbow obl (1 of 2)]
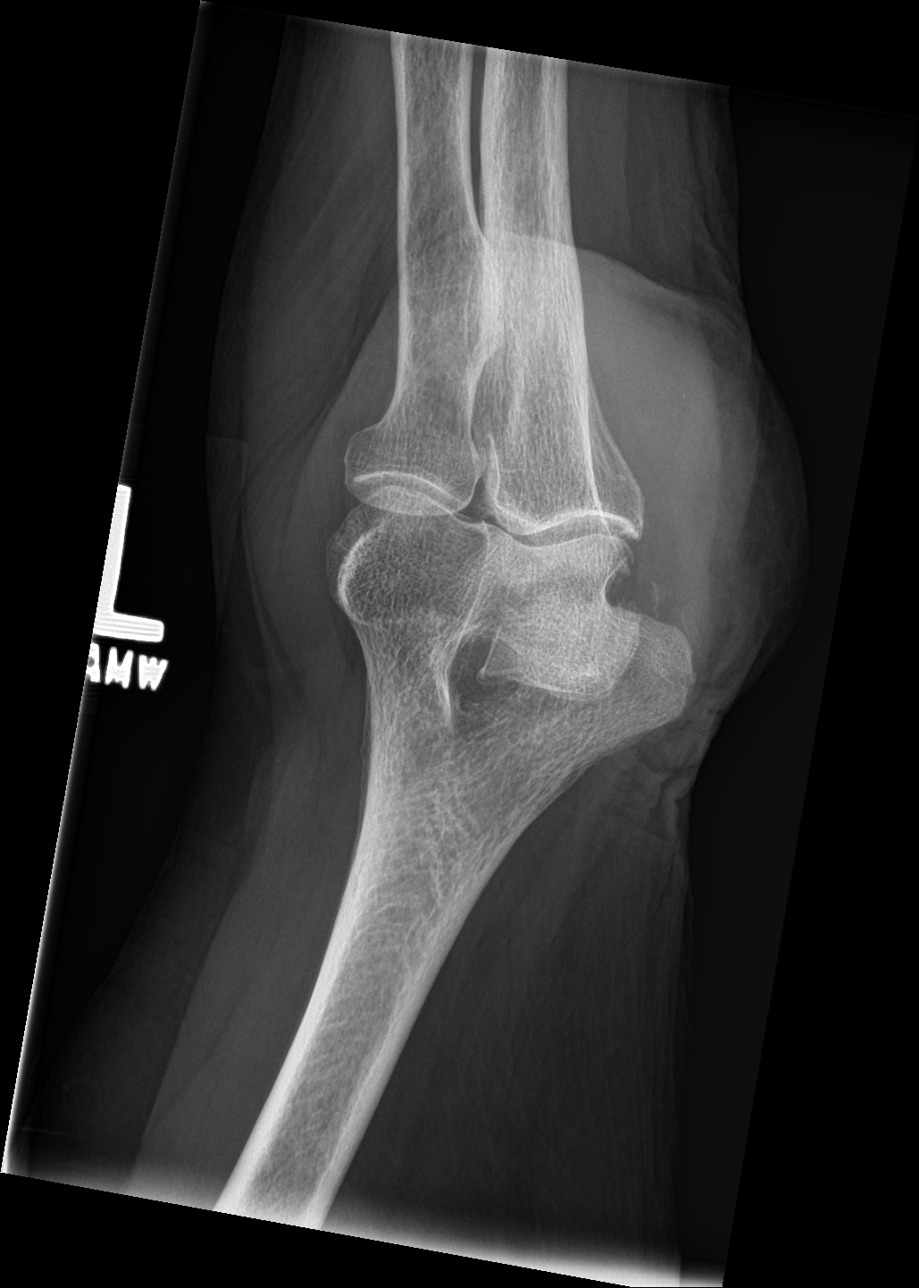

[elbow obl (2 of 2)]
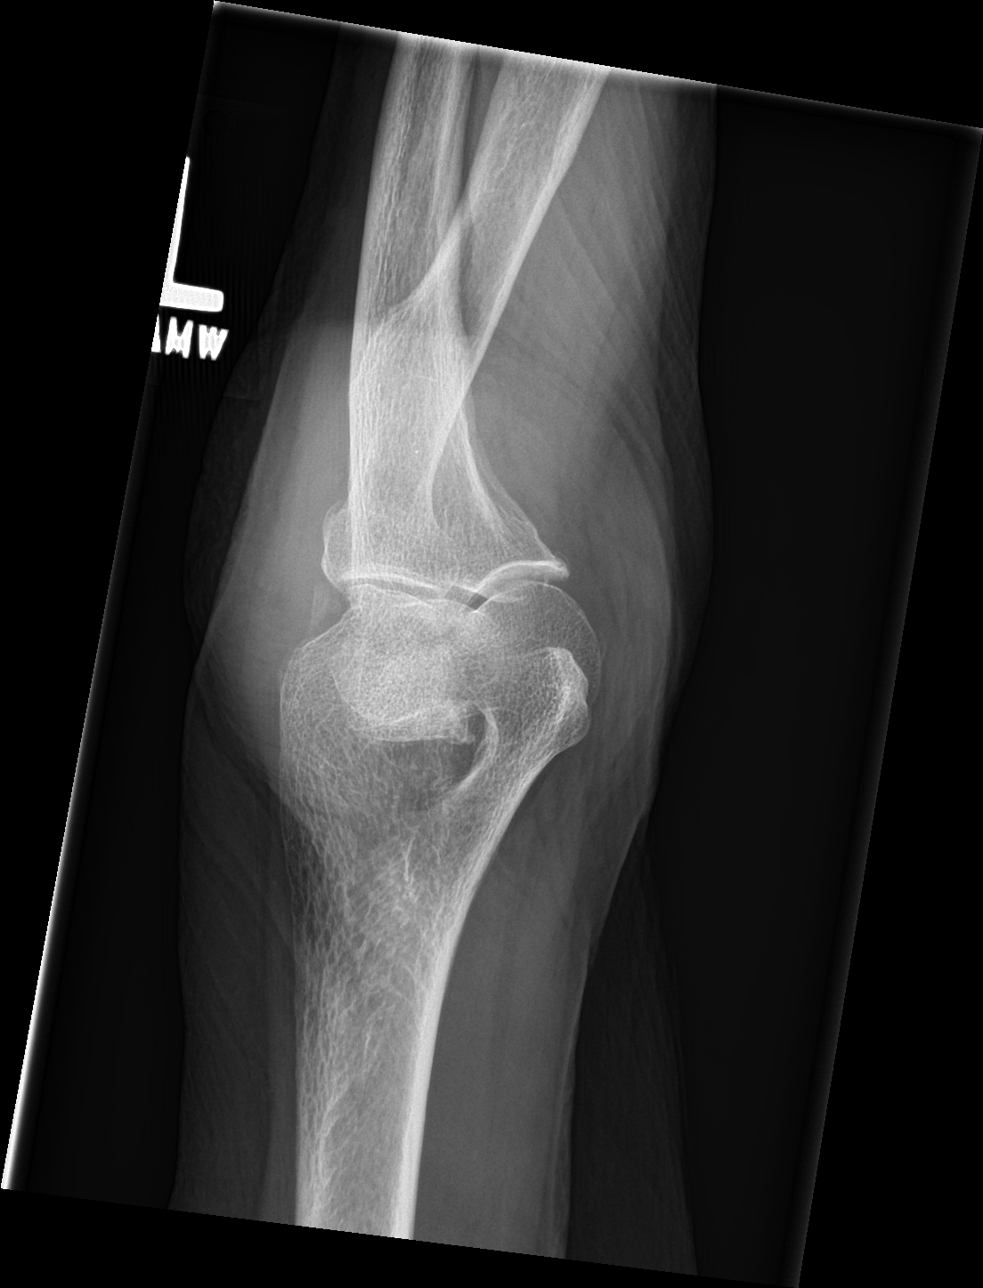

[elbow lat]
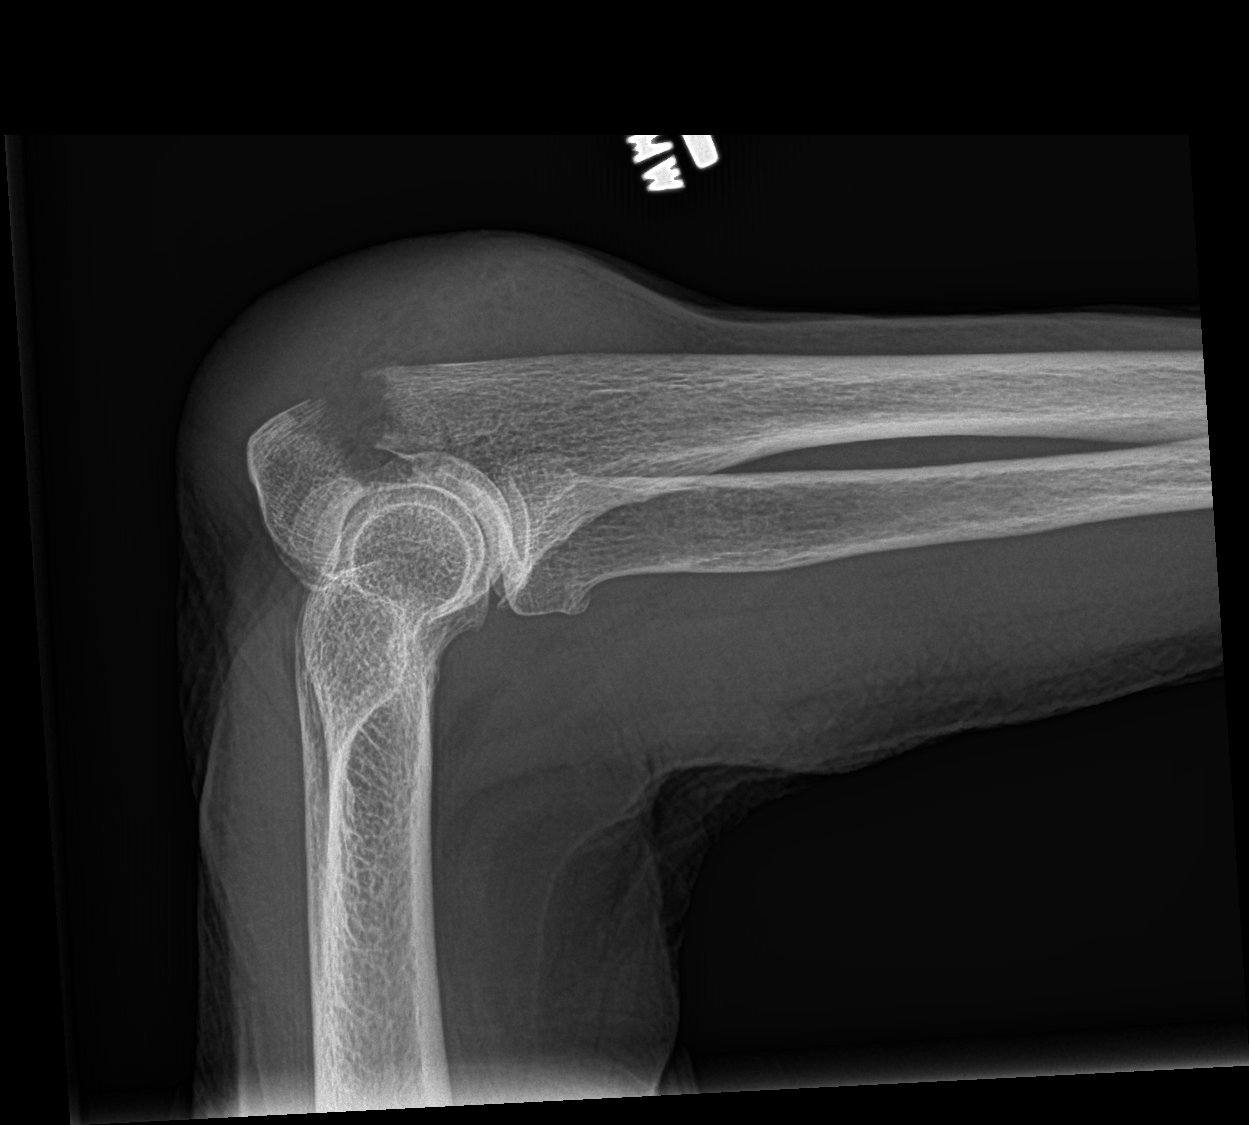

[4 of 4 positions shown; findings below may reference images not displayed]

FINDINGS: A mildly displaced fracture is seen involving the olecranon process
of the ulna, with intra-articular extension.

No other fractures are identified. No evidence of dislocation.
Prominent soft tissue swelling noted as well as small elbow joint
effusion.
IMPRESSION: Proximal ulnar fracture near the olecranon process, with
intra-articular extension.

## 2016-02-09 IMAGING — DX DG HUMERUS 2V *L*
4 series · 4 of 4 positions shown · non-contrast
Comparison: None.

CLINICAL DATA: Reason elbow fracture. Rolled over on arm while
sleeping today. Generalized pain.

EXAM:
LEFT HUMERUS - 2+ VIEW

[humerus ap (1 of 3)]
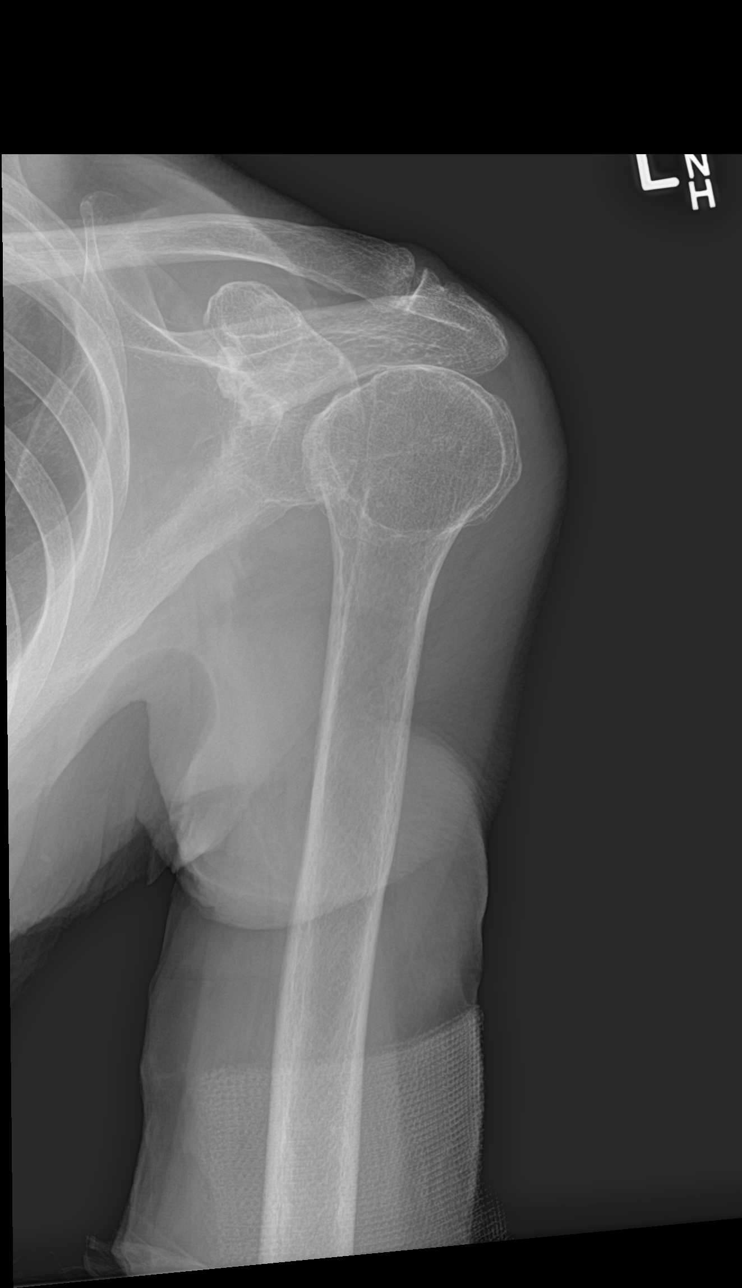

[humerus lat]
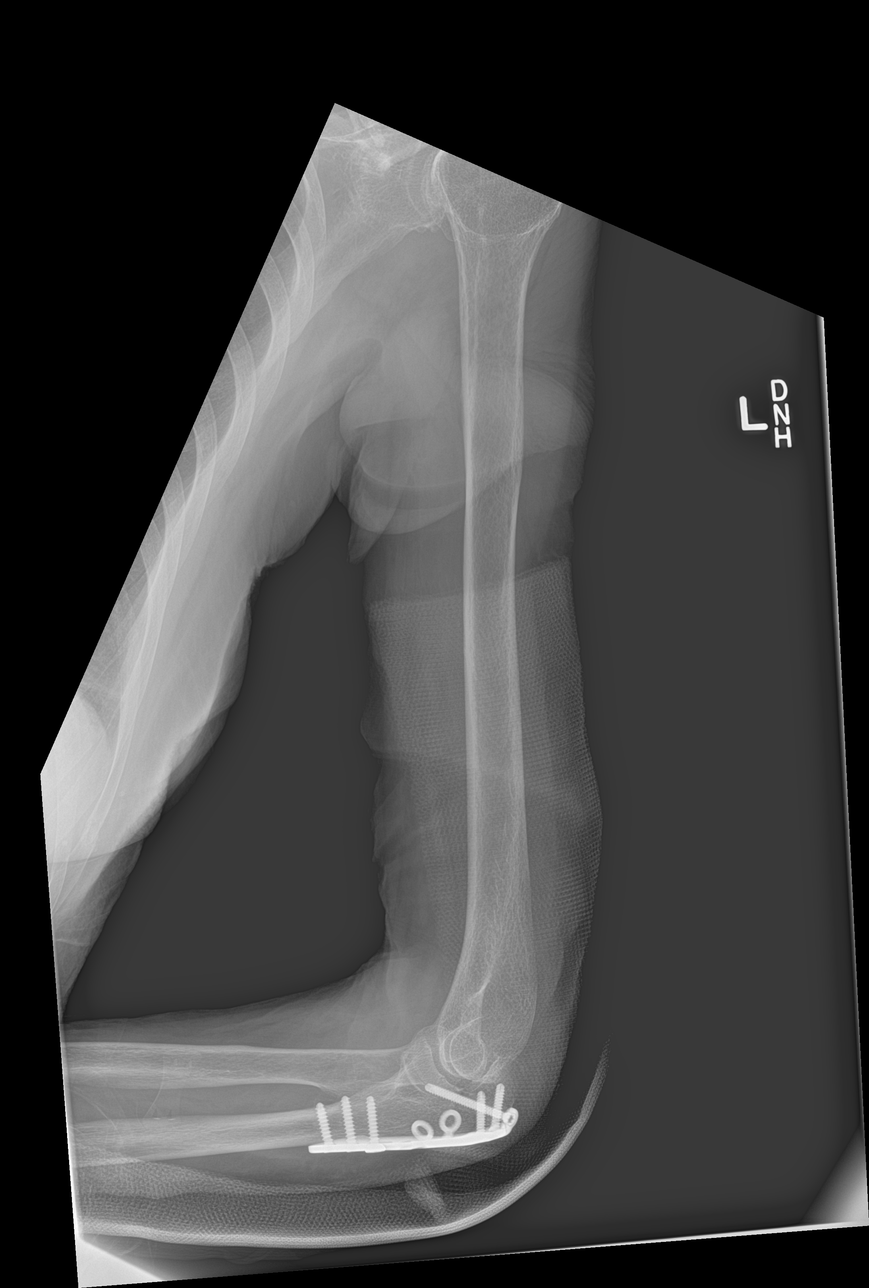

[humerus ap (2 of 3)]
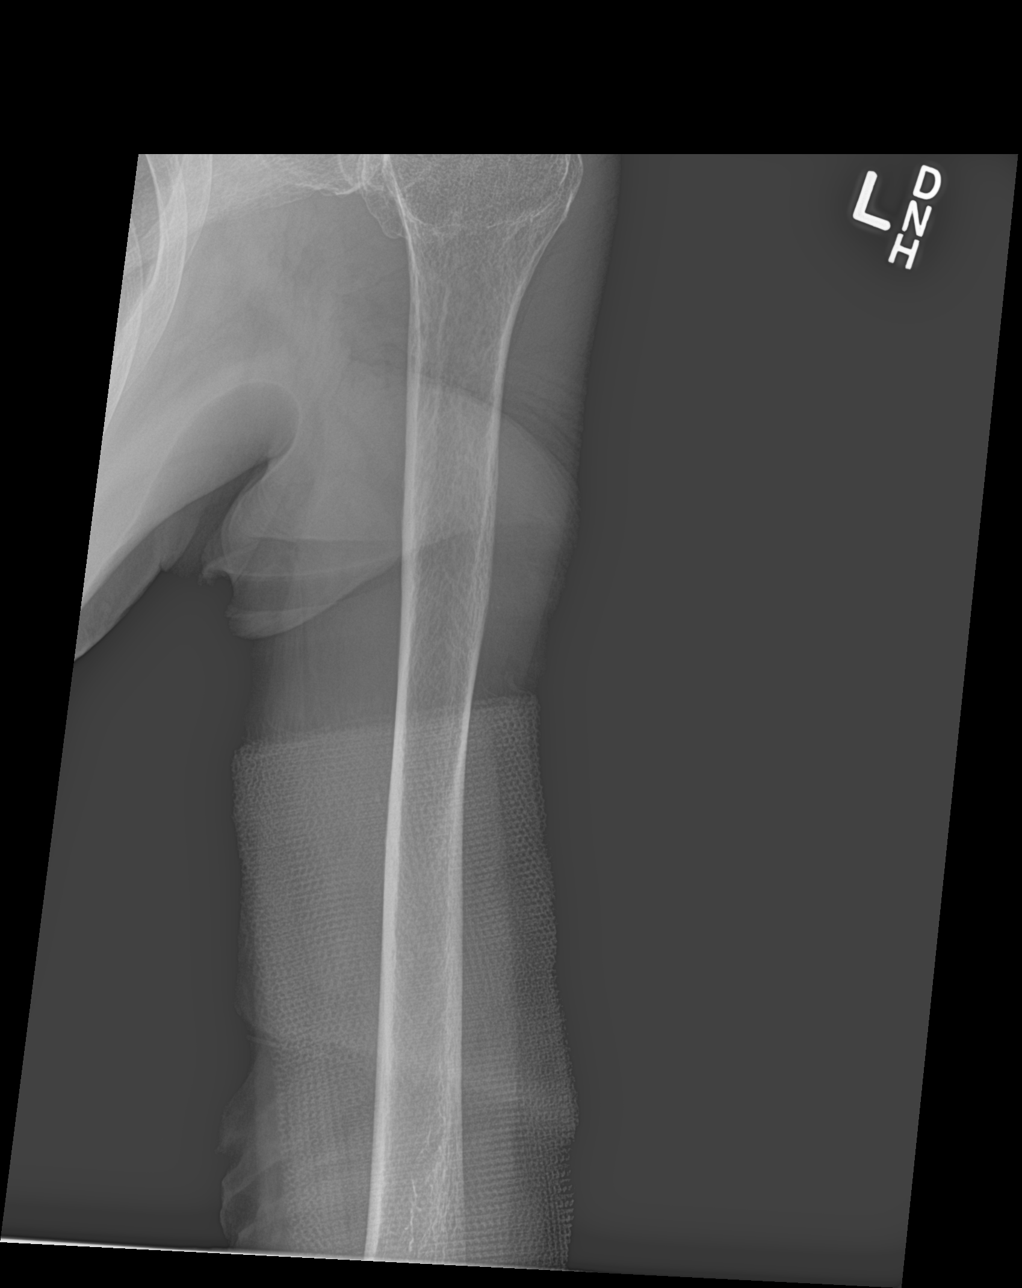

[humerus ap (3 of 3)]
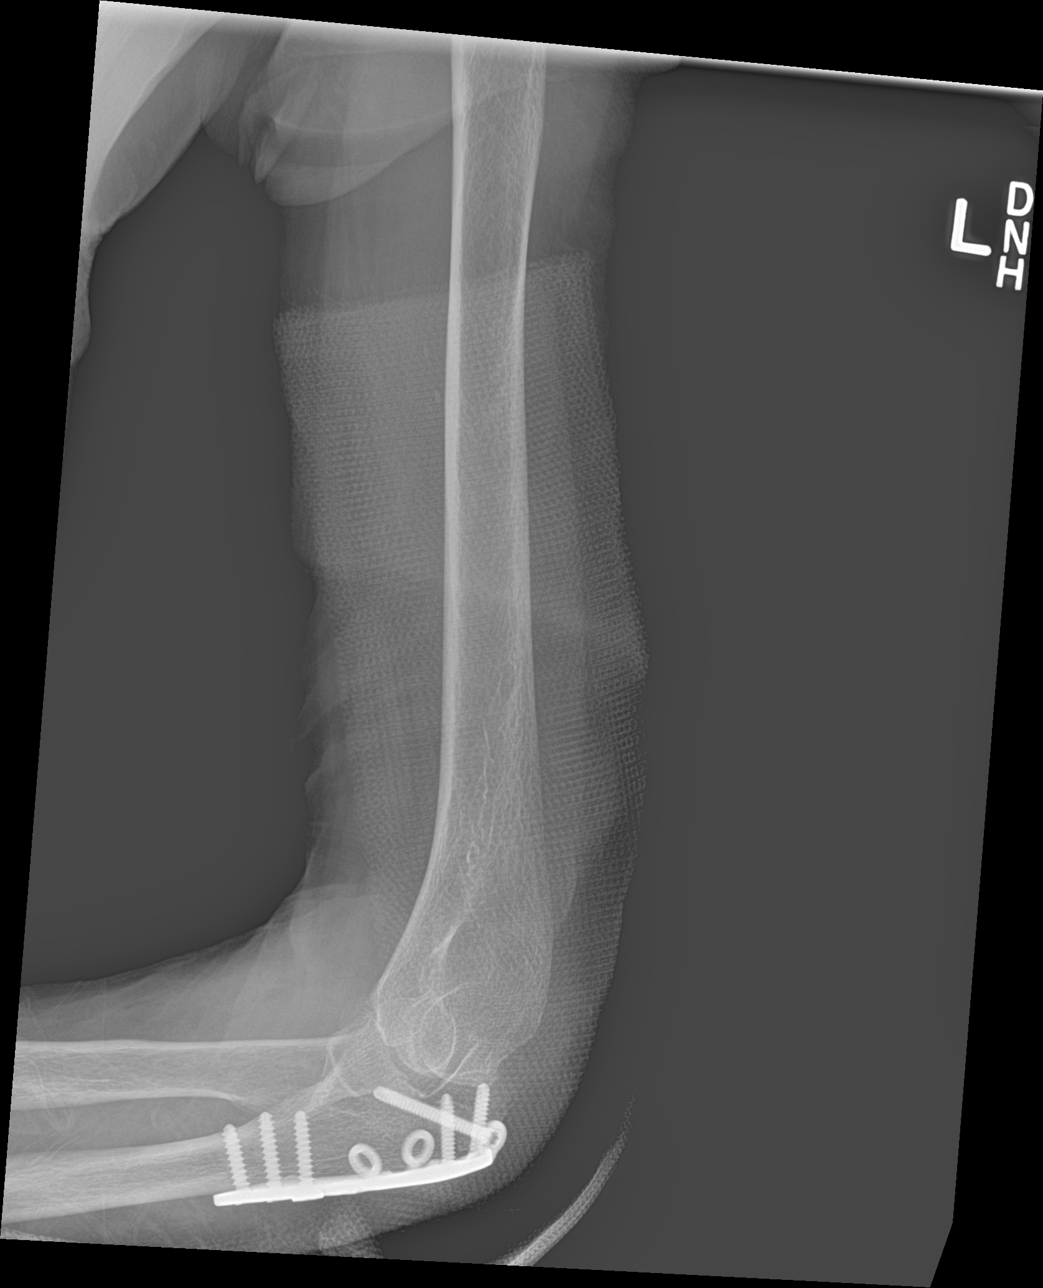

[4 of 4 positions shown; findings below may reference images not displayed]

FINDINGS: Plate and screw fixation device noted across the fracture at the
base of the left proximal ulna olecranon process. Mild degenerative
changes within the left shoulder. No acute bony abnormality within
the humerus. No fracture, subluxation or dislocation.
IMPRESSION: No acute bony abnormality within the humerus.

Internal fixation across the fracture at the base of the left
olecranon process in anatomic alignment.

## 2016-02-12 DIAGNOSIS — M79601 Pain in right arm: Secondary | ICD-10-CM | POA: Diagnosis not present

## 2016-07-29 DIAGNOSIS — F411 Generalized anxiety disorder: Secondary | ICD-10-CM | POA: Diagnosis not present

## 2016-07-29 DIAGNOSIS — H00014 Hordeolum externum left upper eyelid: Secondary | ICD-10-CM | POA: Diagnosis not present

## 2016-08-11 DIAGNOSIS — H35051 Retinal neovascularization, unspecified, right eye: Secondary | ICD-10-CM | POA: Diagnosis not present

## 2016-08-20 ENCOUNTER — Encounter (INDEPENDENT_AMBULATORY_CARE_PROVIDER_SITE_OTHER): Payer: Medicare Other | Admitting: Ophthalmology

## 2016-08-26 DIAGNOSIS — N390 Urinary tract infection, site not specified: Secondary | ICD-10-CM | POA: Diagnosis not present

## 2016-08-26 DIAGNOSIS — I10 Essential (primary) hypertension: Secondary | ICD-10-CM | POA: Diagnosis not present

## 2016-08-26 DIAGNOSIS — E559 Vitamin D deficiency, unspecified: Secondary | ICD-10-CM | POA: Diagnosis not present

## 2016-09-02 DIAGNOSIS — M4120 Other idiopathic scoliosis, site unspecified: Secondary | ICD-10-CM | POA: Diagnosis not present

## 2016-09-02 DIAGNOSIS — M810 Age-related osteoporosis without current pathological fracture: Secondary | ICD-10-CM | POA: Diagnosis not present

## 2016-09-02 DIAGNOSIS — F411 Generalized anxiety disorder: Secondary | ICD-10-CM | POA: Diagnosis not present

## 2016-09-02 DIAGNOSIS — R32 Unspecified urinary incontinence: Secondary | ICD-10-CM | POA: Diagnosis not present

## 2016-09-02 DIAGNOSIS — Z7982 Long term (current) use of aspirin: Secondary | ICD-10-CM | POA: Diagnosis not present

## 2016-09-10 ENCOUNTER — Encounter (INDEPENDENT_AMBULATORY_CARE_PROVIDER_SITE_OTHER): Payer: Medicare Other | Admitting: Ophthalmology

## 2016-09-10 DIAGNOSIS — H353231 Exudative age-related macular degeneration, bilateral, with active choroidal neovascularization: Secondary | ICD-10-CM

## 2016-09-10 DIAGNOSIS — H35033 Hypertensive retinopathy, bilateral: Secondary | ICD-10-CM | POA: Diagnosis not present

## 2016-09-10 DIAGNOSIS — I10 Essential (primary) hypertension: Secondary | ICD-10-CM

## 2016-09-10 DIAGNOSIS — H43813 Vitreous degeneration, bilateral: Secondary | ICD-10-CM

## 2016-09-23 ENCOUNTER — Telehealth: Payer: Self-pay

## 2016-09-23 NOTE — Telephone Encounter (Signed)
I again spoke with Dr. Rexene Alberts, and it was agreed upon that Dr. Rexene Alberts will see this pt in consultation tomorrow. Our referrals dept called this pt to advise them that this pt will be kept tomorrow.

## 2016-09-23 NOTE — Telephone Encounter (Signed)
It appears that this pt has no showed 2 appts with Dr. Rexene Alberts in 2017. Per notefrom 07/11/2015, "I called Dr. Pennie Banter office to leave a message.  Patient has no showed last 2 appointments which were re-referrals from Dr. Shelia Media. I advised that patient/family are non-compliant with treatment recommendations. I advised that patient may be dischargded from our office due to No Show policy. Staff reported that they will give message to Dr. Shelia Media."  I called the home number listed, a female named Suanne Marker (this female was hard to understand) answered the phone and said she was taking care of the pt and was aware of tomorrow's appt. I advised her that I believe this appt was made in error, and should be cancelled, but that I would verify with Dr. Rexene Alberts this again and our referrals dept will reach out to the pt if necessary.  I spoke to Dr. Rexene Alberts and she agrees, this pt should not be seen at [facility] any longer and should follow up with Dr. Shelia Media.

## 2016-09-24 ENCOUNTER — Encounter: Payer: Self-pay | Admitting: Neurology

## 2016-09-24 ENCOUNTER — Ambulatory Visit (INDEPENDENT_AMBULATORY_CARE_PROVIDER_SITE_OTHER): Payer: Medicare Other | Admitting: Neurology

## 2016-09-24 VITALS — BP 133/80 | HR 77 | Ht 65.0 in | Wt 116.0 lb

## 2016-09-24 DIAGNOSIS — F0280 Dementia in other diseases classified elsewhere without behavioral disturbance: Secondary | ICD-10-CM | POA: Diagnosis not present

## 2016-09-24 NOTE — Patient Instructions (Signed)
Your main issue is your living situation. You are not safe to live by yourself.   I have made suggestions about your memory medications and testing and referrals before but you have not followed through with the recommendations and do not stay in the medications I suggested.   I will let Dr. Shelia Media know my concerns. He can consider a memory medication or restarting a medication, when you are in a safe, stable, structured environment, such as assistant living. Prescribing yet another memory is not the solution.

## 2016-09-24 NOTE — Progress Notes (Signed)
Subjective:    Patient ID: [name] is a 83 y.o. female.  HPI     Interim history:   [name] is an 83 year old right-handed woman with an underlying complex medical history of osteoarthritis, reflux disease, recurrent headaches, history of TIA, vitamin B12 deficiency, diverticulosis, restless leg syndrome, chronic constipation, scoliosis, osteoporosis, anxiety, who presents for re-consultation of her dementia. She is re-referred once again by [name], her primary care physician, and I reviewed his office note from 06/11/2015 previously, at which time she was advised to continue with Namzaric. She is accompanied by her sitter and friend of 7 years, [name] (on DPR and healthcare POA, as I understand, per [place]) today. Of note, the patient no showed for appointments on 03/25/2014, 05/31/2015 as well as on 07/11/2015.  I last saw her on 01/25/2015, at which time she reported doing fairly. She was living in her own home and [name], her niece, was helping out. [name]'s sister came one time. [name] had moved in with the patient in June 2016 after the patient had sustained a fall and fractured her left elbow. She was taking trazodone 100 mg each night. She was on donepezil 10 mg daily since July or August 2016. She was also on Namenda XR at the time. Her MMSE was 19/30, CDT: 1/4, AFT: 6/min at the time. I ordered a head CT and referred her for full neuropsychological evaluation. However, her family called back and declined the appointment with [name].   Today, 09/24/2016 (all dictated new, as well as above notes, some dictation done in note pad or Word, outside of chart, may appear as copied):   She reports that she cannot live alone any longer. She no longer drives. She has not driven in some time. She does not do her own groceries, she has a Actuary, [name] checks in on her but is not a Runner, broadcasting/film/video. She is having her own medical problems and is not able to come regularly. The patient  apparently has refused other sisters. Her nieces are normal longer involved as I understand. She has a nephew who lives in [name]. She is not sure why she is no longer on memory medications. She tries to hydrate better. She has not had any recent falls. She has a call alert button but does not wear it. She gets Meals on Wheels. [name] reports that they have looked into assisted living facilities but the patient does not like most places. She has looked into [facility] [place] for example. Of note, sometime in 2017 she appeared to be on Namzaric. I reviewed the office note from [name] from 09/02/2016. She seems to no longer be on any memory medication, for unclear reasons. [name] particularly requests advice regarding starting meds for her memory loss. Of note, when I first met her on 10/20/2013, her MMSE was 22 out of 30, she had been started on Aricept 5 mg by her primary care physician in or around May 2015 and I suggested she increase this to 10 mg daily. In October 2016 when I saw her she was on 10 mg of donepezil which had apparently been restarted in 2016 and she was on long-acting Namenda. Of note, I had referred her for neuropsychological testing in October but she or her family called and declined the appointment with [name]. I also ordered a head CT in July 2015 which was not done, I then ordered a head CT in October 2016 which was not done.  Of  note, she was seen in the emergency room on 04/23/2015 after a fall with head injury reported. She had tripped on stairs. She fell backwards. She sustained a laceration to left posterior scalp and skin tear to left forearm and wrist. She had a head and cervical spine CT without contrast on 04/23/2015: IMPRESSION: No cervical spine fracture or traumatic subluxation. No skull fracture or intracranial hemorrhage. LEFT parietal scalp hematoma. Atrophy and small vessel disease. In addition, I personally reviewed the images through the PACS system.    The  patient's allergies, current medications, family history, past medical history, past social history, past surgical history and problem list were reviewed and updated as appropriate.   Previously (copied from previous notes for reference):   I first met her on 10/20/2013 at the request of her primary care physician, at which time she reported a one year history of memory loss. Her MMSE was 22/30 at the time. She had been started on Aricept 5 mg in May 2015 and I suggested she discuss with her primary care physician increasing the dose to 10 mg daily. I also suggested a repeat head CT without contrast to compare with her scan from 2013. She did not have a head CT done. I did some blood work on 10/25/2013. She had a normal TSH, normal RPR, normal ANA and borderline hemoglobin A1c of 5.9. We called her with the test results.  10/20/2013: She has had memory loss for the past year. She lives alone in a one story home. She recently had blood work on 09/15/2013 which I reviewed: CBC was unremarkable, BMP showed a BUN of 19, total bili was borderline, total cholesterol was borderline at 203, LDL was 117, urinalysis was negative. You recently started her on generic Aricept 5 mg in 5/15 and she has been on it since then and takes 5 mg. She drives only locally, not at night, not [place] the [place].     She had head CT without contrast on 05/13/2011 after a fall: No acute intracranial abnormality. 2. Atrophy and chronic microvascular white matter ischemic changes.  She is the youngest of a total of 13 kids, has no living siblings, no children, is widowed. Her niece is the closest relative and lives in [place].   She gets Meals on Wheels. There is no issue with hallucinations, no delusions. She would be agreeable to have an aid during the day.     Her Past Medical History Is Significant For: Past Medical History:  Diagnosis Date  . Atrophic vaginitis   . Diverticulosis   . Gilbert disease   .  Hyperlipidemia   . Mini stroke ([place])   . Osteoporosis   . Shingles 11/02  . Stroke ([place]) 4/00  . Vascular headache   . Vulvitis 2004    Her Past Surgical History Is Significant For: Past Surgical History:  Procedure Laterality Date  . CATARACT EXTRACTION  04/2003   bilateral    Her Family History Is Significant For: Family History  Problem Relation Age of Onset  . Hypertension Mother   . Hypertension Father   . Heart disease Father   . Breast cancer Sister   . Alzheimer's disease Brother     Her Social History Is Significant For: Social History   Social History  . Marital status: Widowed    Spouse name: N/A  . Number of children: N/A  . Years of education: N/A   Social History Main Topics  . Smoking status: Never Smoker  . Smokeless  tobacco: Never Used  . Alcohol use No  . Drug use: No  . Sexual activity: Not Asked   Other Topics Concern  . None   Social History Narrative  . None    Her Allergies Are:  Allergies  Allergen Reactions  . Prolia [Denosumab]   . Penicillins Rash    Has patient had a PCN reaction causing immediate rash, facial/tongue/throat swelling, SOB or lightheadedness with hypotension: No Has patient had a PCN reaction causing severe rash involving mucus membranes or skin necrosis: No Has patient had a PCN reaction that required hospitalization No Has patient had a PCN reaction occurring within the last 10 years: No If all of the above answers are "NO", then may proceed with Cephalosporin use.  :   Her Current Medications Are:  Outpatient Encounter Prescriptions as of 09/24/2016  Medication Sig  . Cholecalciferol (VITAMIN D PO) Take by mouth.  . Multiple Vitamin (MULTIVITAMIN) tablet Take 1 tablet by mouth daily.  . Multiple Vitamins-Minerals (PRESERVISION AREDS PO) Take by mouth.  . [DISCONTINUED] NAMENDA XR 28 MG CP24 24 hr capsule Take 28 mg by mouth at bedtime.   . [DISCONTINUED] traZODone (DESYREL) 100 MG tablet Take 100 mg by  mouth at bedtime.   No facility-administered encounter medications on file as of 09/24/2016.   :  Review of Systems:  Out of a complete 14 point review of systems, all are reviewed and negative with the exception of these symptoms as listed below:  Review of Systems  Neurological:       Pt presents today to discuss her short term memory loss. Pt has stopped taking namenda.    Objective:  Neurologic Exam  Physical Exam Physical Examination:   Vitals:   09/24/16 1403  BP: 133/80  Pulse: 77   General Examination: The patient is a very pleasant 83 y.o. female in no acute distress. She appears well-developed and well-nourished and  groomed.   HEENT: Normocephalic, atraumatic, pupils are equal, round and reactive to light and accommodation. Extraocular tracking shows no Significant difficulty tracking, no nystagmus is noted, hearing is grossly intact. Face is symmetric, no facial masking is noted, no facial tremor is noted. She has fairly good range of motion in her neck. Airway examination reveals mild mouth dryness, otherwise no significant airway crowding. Tongue protrudes centrally and palate elevates symmetrically. Speech is clear.  Chest: is clear to auscultation without wheezing, rhonchi or crackles noted.  Heart: sounds are regular and normal without murmurs, rubs or gallops noted.   Abdomen: is soft, non-tender and non-distended with normal bowel sounds appreciated on auscultation.  Extremities: There is no pitting edema in the distal lower extremities bilaterally. Pedal pulses are intact.   Skin: is warm and dry with no trophic changes noted. Age-related changes are noted on the skin. Multiple bruises are noted in her forearms, chronic appearing changes.    Musculoskeletal: exam reveals no obvious joint deformities, tenderness or joint swelling or erythema. Changes consistent with OA of the hands are noted bilaterally.   Neurologically:  Mental status: The patient is  awake and alert, paying good attention. She is able to partially provide the history. [name] provides details. She is oriented to: person, place,  situation, day of week, month, city, state. Her memory, attention, language and knowledge are impaired. There is no aphasia, agnosia, apraxia or anomia. There is a no significant degree of bradyphrenia. Speech is not hypophonic with no dysarthria noted.   In 7/15: Her MMSE (Mini-Mental state exam) score  is 22/30. CDT (Clock Drawing Test) score is 4/4.  AFT (Animal Fluency Test) score is 18.   On 01/25/2015: MMSE: 19/30, CDT: 1/4, AFT: 6/min.  On 09/24/2016: MMSE: 21/30, CDT: 2/4, AFT: 5/min.   Cranial nerves are as described above under HEENT exam. In addition, shoulder shrug is normal with equal shoulder height noted.  Motor exam:  thin bulk, global strength of 4+ out of 5, could be normal for age, no significant rigidity, no tremor. She has 1+ reflexes in the upper extremities, trace in the lower extremities. Romberg is not tested for safety.  Fine motor skills:  globally mildly impaired, but acceptable for age.    Cerebellar testing shows no dysmetria or intention tremor. There is no truncal or gait ataxia.   Sensory exam is intact to light touch in the upper and lower extremities.   Gait, station and balance: She stands up from the seated position with mild difficulty and needs to push herself up. No veering to one side is noted. No leaning to one side. Posture is moderately stooped and there is significant kyphoscoliosis. Stance is wide-based. She turns in 3 steps. Tandem walk is not possible. Balance is mildly impaired. She uses her  4-prong cane.     Assessment and Plan:   In summary, Carmelle L Steinberg is a very pleasant 83 year old female with an underlying complex medical history of osteoarthritis, reflux disease, recurrent headaches, history of TIA, vitamin B12 deficiency, diverticulosis, restless leg syndrome, chronic constipation,  scoliosis, osteoporosis, anxiety, who presents for re-consultation of her memory loss of  several years duration. Of note, I have made suggestions in the past including testing which were not followed through. She has been in the past on Aricept, then Namenda long-acting, then was switched apparently to [place], unclear  why she is no longer on any memory medications. Her nieces are no longer involved in her care, as I understand, Fulton Reek, her nephew is somewhat involved in her care, he lives in [place]. She has a sister who is also a longer term friends of the past 7 years and [name] has been involved for the past year or so in her care. exactly. I am somewhat confused as to my role in the patient's care.  I have made suggestions as to the Aricept dosing and the Namenda in the past but for some reason she is no longer on either one of these medications. I do not think it is in her best interest that I prescribed yet another memory medication, her main issue is her living situation. I did not feel it is safe for her to live by herself. She does not have a daily caretaker. She reports that she cannot move to [place] or does not wish to move to [place] as she lived there in the past. She has not accepted any other sister as I understand. She needs to consider moving into assisted living. I will make these recommendations to [name] today. He can consider restarting Aricept and or Namenda generic when she is in a safe, structured and monitored environment for her medications can be given to her on a daily basis. For now, I do not suggest making a follow-up appointment with me and she is strongly encouraged to follow-up with her primary care physician to address these issues of her day-to-day living, transitioning to assisted living as well. She is advised to start wearing her call alert button. She is advised that she may not be safe to cook for  herself. She gets most of her meals delivered but sometimes cooks  breakfast for herself. Given her memory loss, she may not be safe to even do this much. Her sister has not been able to come daily and not even once a week sometimes as [name] has had her own medical issues and appointments lately.  We talked about maintaining a healthy lifestyle in general and staying active mentally and physically. I encouraged the patient to eat healthy, exercise daily and keep well hydrated, to keep a scheduled bedtime and wake time routine.   I did not order any new test today. I have previously made referral to neuropsychological testing and ordered a head CT on 2 occasions. I suggested follow-up with primary care to address her current living situation which I think takes precedence over anything else at this time. I answered all their questions today and the patient and [name] were in agreement. Star Age, MD, PhD [facility])

## 2016-10-03 ENCOUNTER — Encounter (INDEPENDENT_AMBULATORY_CARE_PROVIDER_SITE_OTHER): Payer: Medicare Other | Admitting: Ophthalmology

## 2016-10-03 DIAGNOSIS — H43813 Vitreous degeneration, bilateral: Secondary | ICD-10-CM | POA: Diagnosis not present

## 2016-10-03 DIAGNOSIS — H353231 Exudative age-related macular degeneration, bilateral, with active choroidal neovascularization: Secondary | ICD-10-CM

## 2016-10-03 DIAGNOSIS — H35033 Hypertensive retinopathy, bilateral: Secondary | ICD-10-CM

## 2016-10-03 DIAGNOSIS — I10 Essential (primary) hypertension: Secondary | ICD-10-CM | POA: Diagnosis not present

## 2016-10-07 DIAGNOSIS — M810 Age-related osteoporosis without current pathological fracture: Secondary | ICD-10-CM | POA: Diagnosis not present

## 2016-11-07 ENCOUNTER — Encounter (INDEPENDENT_AMBULATORY_CARE_PROVIDER_SITE_OTHER): Payer: Medicare Other | Admitting: Ophthalmology

## 2016-11-07 DIAGNOSIS — H353231 Exudative age-related macular degeneration, bilateral, with active choroidal neovascularization: Secondary | ICD-10-CM

## 2016-11-07 DIAGNOSIS — H43813 Vitreous degeneration, bilateral: Secondary | ICD-10-CM | POA: Diagnosis not present

## 2016-11-07 DIAGNOSIS — H35033 Hypertensive retinopathy, bilateral: Secondary | ICD-10-CM | POA: Diagnosis not present

## 2016-11-07 DIAGNOSIS — I10 Essential (primary) hypertension: Secondary | ICD-10-CM | POA: Diagnosis not present

## 2016-11-15 DIAGNOSIS — M25521 Pain in right elbow: Secondary | ICD-10-CM | POA: Diagnosis not present

## 2016-11-15 DIAGNOSIS — S3992XA Unspecified injury of lower back, initial encounter: Secondary | ICD-10-CM | POA: Diagnosis not present

## 2016-11-15 DIAGNOSIS — E860 Dehydration: Secondary | ICD-10-CM | POA: Diagnosis not present

## 2016-11-15 DIAGNOSIS — G8911 Acute pain due to trauma: Secondary | ICD-10-CM | POA: Diagnosis not present

## 2016-11-15 DIAGNOSIS — R0902 Hypoxemia: Secondary | ICD-10-CM | POA: Diagnosis not present

## 2016-11-15 DIAGNOSIS — I480 Paroxysmal atrial fibrillation: Secondary | ICD-10-CM | POA: Diagnosis not present

## 2016-11-15 DIAGNOSIS — R339 Retention of urine, unspecified: Secondary | ICD-10-CM | POA: Diagnosis not present

## 2016-11-15 DIAGNOSIS — S329XXA Fracture of unspecified parts of lumbosacral spine and pelvis, initial encounter for closed fracture: Secondary | ICD-10-CM | POA: Diagnosis not present

## 2016-11-15 DIAGNOSIS — R4182 Altered mental status, unspecified: Secondary | ICD-10-CM | POA: Diagnosis not present

## 2016-11-15 DIAGNOSIS — M25551 Pain in right hip: Secondary | ICD-10-CM | POA: Diagnosis not present

## 2016-11-15 DIAGNOSIS — S32512A Fracture of superior rim of left pubis, initial encounter for closed fracture: Secondary | ICD-10-CM | POA: Diagnosis not present

## 2016-11-15 DIAGNOSIS — M47816 Spondylosis without myelopathy or radiculopathy, lumbar region: Secondary | ICD-10-CM | POA: Diagnosis not present

## 2016-11-15 DIAGNOSIS — M25552 Pain in left hip: Secondary | ICD-10-CM | POA: Diagnosis not present

## 2016-11-15 DIAGNOSIS — M160 Bilateral primary osteoarthritis of hip: Secondary | ICD-10-CM | POA: Diagnosis not present

## 2016-11-15 DIAGNOSIS — F0390 Unspecified dementia without behavioral disturbance: Secondary | ICD-10-CM | POA: Diagnosis not present

## 2016-11-15 DIAGNOSIS — Z043 Encounter for examination and observation following other accident: Secondary | ICD-10-CM | POA: Diagnosis not present

## 2016-11-15 DIAGNOSIS — S32592A Other specified fracture of left pubis, initial encounter for closed fracture: Secondary | ICD-10-CM | POA: Diagnosis not present

## 2016-11-16 DIAGNOSIS — R0902 Hypoxemia: Secondary | ICD-10-CM | POA: Diagnosis not present

## 2016-11-16 DIAGNOSIS — I77819 Aortic ectasia, unspecified site: Secondary | ICD-10-CM | POA: Diagnosis not present

## 2016-11-16 DIAGNOSIS — S329XXA Fracture of unspecified parts of lumbosacral spine and pelvis, initial encounter for closed fracture: Secondary | ICD-10-CM | POA: Diagnosis not present

## 2016-11-17 DIAGNOSIS — S329XXA Fracture of unspecified parts of lumbosacral spine and pelvis, initial encounter for closed fracture: Secondary | ICD-10-CM | POA: Diagnosis not present

## 2016-11-17 DIAGNOSIS — S32592A Other specified fracture of left pubis, initial encounter for closed fracture: Secondary | ICD-10-CM | POA: Diagnosis present

## 2016-11-17 DIAGNOSIS — R279 Unspecified lack of coordination: Secondary | ICD-10-CM | POA: Diagnosis not present

## 2016-11-17 DIAGNOSIS — I499 Cardiac arrhythmia, unspecified: Secondary | ICD-10-CM | POA: Diagnosis not present

## 2016-11-17 DIAGNOSIS — R195 Other fecal abnormalities: Secondary | ICD-10-CM | POA: Diagnosis not present

## 2016-11-17 DIAGNOSIS — E876 Hypokalemia: Secondary | ICD-10-CM | POA: Diagnosis not present

## 2016-11-17 DIAGNOSIS — M6281 Muscle weakness (generalized): Secondary | ICD-10-CM | POA: Diagnosis not present

## 2016-11-17 DIAGNOSIS — Z602 Problems related to living alone: Secondary | ICD-10-CM | POA: Diagnosis present

## 2016-11-17 DIAGNOSIS — M25552 Pain in left hip: Secondary | ICD-10-CM | POA: Diagnosis not present

## 2016-11-17 DIAGNOSIS — R488 Other symbolic dysfunctions: Secondary | ICD-10-CM | POA: Diagnosis not present

## 2016-11-17 DIAGNOSIS — S329XXD Fracture of unspecified parts of lumbosacral spine and pelvis, subsequent encounter for fracture with routine healing: Secondary | ICD-10-CM | POA: Diagnosis not present

## 2016-11-17 DIAGNOSIS — R339 Retention of urine, unspecified: Secondary | ICD-10-CM | POA: Diagnosis not present

## 2016-11-17 DIAGNOSIS — E860 Dehydration: Secondary | ICD-10-CM | POA: Diagnosis present

## 2016-11-17 DIAGNOSIS — I4891 Unspecified atrial fibrillation: Secondary | ICD-10-CM | POA: Diagnosis not present

## 2016-11-17 DIAGNOSIS — I088 Other rheumatic multiple valve diseases: Secondary | ICD-10-CM | POA: Diagnosis not present

## 2016-11-17 DIAGNOSIS — I081 Rheumatic disorders of both mitral and tricuspid valves: Secondary | ICD-10-CM | POA: Diagnosis not present

## 2016-11-17 DIAGNOSIS — R0902 Hypoxemia: Secondary | ICD-10-CM | POA: Diagnosis present

## 2016-11-17 DIAGNOSIS — I480 Paroxysmal atrial fibrillation: Secondary | ICD-10-CM | POA: Diagnosis not present

## 2016-11-17 DIAGNOSIS — F0390 Unspecified dementia without behavioral disturbance: Secondary | ICD-10-CM | POA: Diagnosis not present

## 2016-11-20 DIAGNOSIS — F22 Delusional disorders: Secondary | ICD-10-CM | POA: Diagnosis not present

## 2016-11-20 DIAGNOSIS — F809 Developmental disorder of speech and language, unspecified: Secondary | ICD-10-CM | POA: Diagnosis not present

## 2016-11-20 DIAGNOSIS — I10 Essential (primary) hypertension: Secondary | ICD-10-CM | POA: Diagnosis not present

## 2016-11-20 DIAGNOSIS — R451 Restlessness and agitation: Secondary | ICD-10-CM | POA: Diagnosis not present

## 2016-11-20 DIAGNOSIS — R262 Difficulty in walking, not elsewhere classified: Secondary | ICD-10-CM | POA: Diagnosis not present

## 2016-11-20 DIAGNOSIS — F0390 Unspecified dementia without behavioral disturbance: Secondary | ICD-10-CM | POA: Diagnosis not present

## 2016-11-20 DIAGNOSIS — R488 Other symbolic dysfunctions: Secondary | ICD-10-CM | POA: Diagnosis not present

## 2016-11-20 DIAGNOSIS — S329XXD Fracture of unspecified parts of lumbosacral spine and pelvis, subsequent encounter for fracture with routine healing: Secondary | ICD-10-CM | POA: Diagnosis not present

## 2016-11-20 DIAGNOSIS — Z4789 Encounter for other orthopedic aftercare: Secondary | ICD-10-CM | POA: Diagnosis not present

## 2016-11-20 DIAGNOSIS — M6281 Muscle weakness (generalized): Secondary | ICD-10-CM | POA: Diagnosis not present

## 2016-11-20 DIAGNOSIS — F0280 Dementia in other diseases classified elsewhere without behavioral disturbance: Secondary | ICD-10-CM | POA: Diagnosis not present

## 2016-11-20 DIAGNOSIS — R339 Retention of urine, unspecified: Secondary | ICD-10-CM | POA: Diagnosis not present

## 2016-11-20 DIAGNOSIS — W19XXXD Unspecified fall, subsequent encounter: Secondary | ICD-10-CM | POA: Diagnosis not present

## 2016-11-20 DIAGNOSIS — G309 Alzheimer's disease, unspecified: Secondary | ICD-10-CM | POA: Diagnosis not present

## 2016-11-20 DIAGNOSIS — R279 Unspecified lack of coordination: Secondary | ICD-10-CM | POA: Diagnosis not present

## 2016-11-20 DIAGNOSIS — F0391 Unspecified dementia with behavioral disturbance: Secondary | ICD-10-CM | POA: Diagnosis not present

## 2016-11-20 DIAGNOSIS — I4891 Unspecified atrial fibrillation: Secondary | ICD-10-CM | POA: Diagnosis not present

## 2016-11-20 DIAGNOSIS — F419 Anxiety disorder, unspecified: Secondary | ICD-10-CM | POA: Diagnosis not present

## 2016-11-20 DIAGNOSIS — R5381 Other malaise: Secondary | ICD-10-CM | POA: Diagnosis not present

## 2016-11-20 DIAGNOSIS — R195 Other fecal abnormalities: Secondary | ICD-10-CM | POA: Diagnosis not present

## 2016-11-20 DIAGNOSIS — S329XXA Fracture of unspecified parts of lumbosacral spine and pelvis, initial encounter for closed fracture: Secondary | ICD-10-CM | POA: Diagnosis not present

## 2016-11-20 DIAGNOSIS — M545 Low back pain: Secondary | ICD-10-CM | POA: Diagnosis not present

## 2016-11-21 DIAGNOSIS — R262 Difficulty in walking, not elsewhere classified: Secondary | ICD-10-CM | POA: Diagnosis not present

## 2016-11-21 DIAGNOSIS — W19XXXD Unspecified fall, subsequent encounter: Secondary | ICD-10-CM | POA: Diagnosis not present

## 2016-11-21 DIAGNOSIS — F0390 Unspecified dementia without behavioral disturbance: Secondary | ICD-10-CM | POA: Diagnosis not present

## 2016-11-21 DIAGNOSIS — R5381 Other malaise: Secondary | ICD-10-CM | POA: Diagnosis not present

## 2016-11-21 DIAGNOSIS — I4891 Unspecified atrial fibrillation: Secondary | ICD-10-CM | POA: Diagnosis not present

## 2016-11-21 DIAGNOSIS — Z4789 Encounter for other orthopedic aftercare: Secondary | ICD-10-CM | POA: Diagnosis not present

## 2016-11-21 DIAGNOSIS — M6281 Muscle weakness (generalized): Secondary | ICD-10-CM | POA: Diagnosis not present

## 2016-11-21 DIAGNOSIS — S329XXA Fracture of unspecified parts of lumbosacral spine and pelvis, initial encounter for closed fracture: Secondary | ICD-10-CM | POA: Diagnosis not present

## 2016-11-24 DIAGNOSIS — R195 Other fecal abnormalities: Secondary | ICD-10-CM | POA: Diagnosis not present

## 2016-11-24 DIAGNOSIS — R339 Retention of urine, unspecified: Secondary | ICD-10-CM | POA: Diagnosis not present

## 2016-11-25 DIAGNOSIS — R262 Difficulty in walking, not elsewhere classified: Secondary | ICD-10-CM | POA: Diagnosis not present

## 2016-11-25 DIAGNOSIS — M6281 Muscle weakness (generalized): Secondary | ICD-10-CM | POA: Diagnosis not present

## 2016-11-25 DIAGNOSIS — Z4789 Encounter for other orthopedic aftercare: Secondary | ICD-10-CM | POA: Diagnosis not present

## 2016-11-25 DIAGNOSIS — S329XXA Fracture of unspecified parts of lumbosacral spine and pelvis, initial encounter for closed fracture: Secondary | ICD-10-CM | POA: Diagnosis not present

## 2016-11-25 DIAGNOSIS — R5381 Other malaise: Secondary | ICD-10-CM | POA: Diagnosis not present

## 2016-11-25 DIAGNOSIS — W19XXXD Unspecified fall, subsequent encounter: Secondary | ICD-10-CM | POA: Diagnosis not present

## 2016-11-25 DIAGNOSIS — F0390 Unspecified dementia without behavioral disturbance: Secondary | ICD-10-CM | POA: Diagnosis not present

## 2016-11-27 DIAGNOSIS — F0390 Unspecified dementia without behavioral disturbance: Secondary | ICD-10-CM | POA: Diagnosis not present

## 2016-11-27 DIAGNOSIS — R5381 Other malaise: Secondary | ICD-10-CM | POA: Diagnosis not present

## 2016-11-27 DIAGNOSIS — Z4789 Encounter for other orthopedic aftercare: Secondary | ICD-10-CM | POA: Diagnosis not present

## 2016-11-27 DIAGNOSIS — M6281 Muscle weakness (generalized): Secondary | ICD-10-CM | POA: Diagnosis not present

## 2016-11-27 DIAGNOSIS — S329XXA Fracture of unspecified parts of lumbosacral spine and pelvis, initial encounter for closed fracture: Secondary | ICD-10-CM | POA: Diagnosis not present

## 2016-11-27 DIAGNOSIS — R262 Difficulty in walking, not elsewhere classified: Secondary | ICD-10-CM | POA: Diagnosis not present

## 2016-11-27 DIAGNOSIS — W19XXXD Unspecified fall, subsequent encounter: Secondary | ICD-10-CM | POA: Diagnosis not present

## 2016-12-01 DIAGNOSIS — F22 Delusional disorders: Secondary | ICD-10-CM | POA: Diagnosis not present

## 2016-12-01 DIAGNOSIS — F0391 Unspecified dementia with behavioral disturbance: Secondary | ICD-10-CM | POA: Diagnosis not present

## 2016-12-01 DIAGNOSIS — R451 Restlessness and agitation: Secondary | ICD-10-CM | POA: Diagnosis not present

## 2016-12-03 DIAGNOSIS — M6281 Muscle weakness (generalized): Secondary | ICD-10-CM | POA: Diagnosis not present

## 2016-12-03 DIAGNOSIS — Z4789 Encounter for other orthopedic aftercare: Secondary | ICD-10-CM | POA: Diagnosis not present

## 2016-12-03 DIAGNOSIS — F0390 Unspecified dementia without behavioral disturbance: Secondary | ICD-10-CM | POA: Diagnosis not present

## 2016-12-03 DIAGNOSIS — R262 Difficulty in walking, not elsewhere classified: Secondary | ICD-10-CM | POA: Diagnosis not present

## 2016-12-03 DIAGNOSIS — S329XXA Fracture of unspecified parts of lumbosacral spine and pelvis, initial encounter for closed fracture: Secondary | ICD-10-CM | POA: Diagnosis not present

## 2016-12-03 DIAGNOSIS — R5381 Other malaise: Secondary | ICD-10-CM | POA: Diagnosis not present

## 2016-12-03 DIAGNOSIS — W19XXXD Unspecified fall, subsequent encounter: Secondary | ICD-10-CM | POA: Diagnosis not present

## 2016-12-05 DIAGNOSIS — M6281 Muscle weakness (generalized): Secondary | ICD-10-CM | POA: Diagnosis not present

## 2016-12-05 DIAGNOSIS — Z4789 Encounter for other orthopedic aftercare: Secondary | ICD-10-CM | POA: Diagnosis not present

## 2016-12-05 DIAGNOSIS — F0390 Unspecified dementia without behavioral disturbance: Secondary | ICD-10-CM | POA: Diagnosis not present

## 2016-12-05 DIAGNOSIS — W19XXXD Unspecified fall, subsequent encounter: Secondary | ICD-10-CM | POA: Diagnosis not present

## 2016-12-05 DIAGNOSIS — S329XXA Fracture of unspecified parts of lumbosacral spine and pelvis, initial encounter for closed fracture: Secondary | ICD-10-CM | POA: Diagnosis not present

## 2016-12-05 DIAGNOSIS — R262 Difficulty in walking, not elsewhere classified: Secondary | ICD-10-CM | POA: Diagnosis not present

## 2016-12-05 DIAGNOSIS — R5381 Other malaise: Secondary | ICD-10-CM | POA: Diagnosis not present

## 2016-12-05 DIAGNOSIS — M545 Low back pain: Secondary | ICD-10-CM | POA: Diagnosis not present

## 2016-12-08 DIAGNOSIS — I10 Essential (primary) hypertension: Secondary | ICD-10-CM | POA: Diagnosis not present

## 2016-12-08 DIAGNOSIS — S329XXD Fracture of unspecified parts of lumbosacral spine and pelvis, subsequent encounter for fracture with routine healing: Secondary | ICD-10-CM | POA: Diagnosis not present

## 2016-12-08 DIAGNOSIS — I4891 Unspecified atrial fibrillation: Secondary | ICD-10-CM | POA: Diagnosis not present

## 2016-12-08 DIAGNOSIS — G309 Alzheimer's disease, unspecified: Secondary | ICD-10-CM | POA: Diagnosis not present

## 2016-12-09 DIAGNOSIS — S329XXA Fracture of unspecified parts of lumbosacral spine and pelvis, initial encounter for closed fracture: Secondary | ICD-10-CM | POA: Diagnosis not present

## 2016-12-09 DIAGNOSIS — R262 Difficulty in walking, not elsewhere classified: Secondary | ICD-10-CM | POA: Diagnosis not present

## 2016-12-09 DIAGNOSIS — Z4789 Encounter for other orthopedic aftercare: Secondary | ICD-10-CM | POA: Diagnosis not present

## 2016-12-09 DIAGNOSIS — F0390 Unspecified dementia without behavioral disturbance: Secondary | ICD-10-CM | POA: Diagnosis not present

## 2016-12-09 DIAGNOSIS — W19XXXD Unspecified fall, subsequent encounter: Secondary | ICD-10-CM | POA: Diagnosis not present

## 2016-12-09 DIAGNOSIS — M6281 Muscle weakness (generalized): Secondary | ICD-10-CM | POA: Diagnosis not present

## 2016-12-09 DIAGNOSIS — M545 Low back pain: Secondary | ICD-10-CM | POA: Diagnosis not present

## 2016-12-09 DIAGNOSIS — R5381 Other malaise: Secondary | ICD-10-CM | POA: Diagnosis not present

## 2016-12-11 ENCOUNTER — Encounter: Payer: Self-pay | Admitting: Internal Medicine

## 2016-12-11 DIAGNOSIS — F809 Developmental disorder of speech and language, unspecified: Secondary | ICD-10-CM | POA: Diagnosis not present

## 2016-12-11 DIAGNOSIS — M6281 Muscle weakness (generalized): Secondary | ICD-10-CM | POA: Diagnosis not present

## 2016-12-11 DIAGNOSIS — S329XXD Fracture of unspecified parts of lumbosacral spine and pelvis, subsequent encounter for fracture with routine healing: Secondary | ICD-10-CM | POA: Diagnosis not present

## 2016-12-11 DIAGNOSIS — R279 Unspecified lack of coordination: Secondary | ICD-10-CM | POA: Diagnosis not present

## 2016-12-12 DIAGNOSIS — F0390 Unspecified dementia without behavioral disturbance: Secondary | ICD-10-CM | POA: Diagnosis not present

## 2016-12-12 DIAGNOSIS — M6281 Muscle weakness (generalized): Secondary | ICD-10-CM | POA: Diagnosis not present

## 2016-12-12 DIAGNOSIS — R262 Difficulty in walking, not elsewhere classified: Secondary | ICD-10-CM | POA: Diagnosis not present

## 2016-12-12 DIAGNOSIS — W19XXXD Unspecified fall, subsequent encounter: Secondary | ICD-10-CM | POA: Diagnosis not present

## 2016-12-12 DIAGNOSIS — M545 Low back pain: Secondary | ICD-10-CM | POA: Diagnosis not present

## 2016-12-12 DIAGNOSIS — S329XXA Fracture of unspecified parts of lumbosacral spine and pelvis, initial encounter for closed fracture: Secondary | ICD-10-CM | POA: Diagnosis not present

## 2016-12-12 DIAGNOSIS — Z4789 Encounter for other orthopedic aftercare: Secondary | ICD-10-CM | POA: Diagnosis not present

## 2016-12-12 DIAGNOSIS — R5381 Other malaise: Secondary | ICD-10-CM | POA: Diagnosis not present

## 2016-12-16 DIAGNOSIS — W19XXXD Unspecified fall, subsequent encounter: Secondary | ICD-10-CM | POA: Diagnosis not present

## 2016-12-16 DIAGNOSIS — R5381 Other malaise: Secondary | ICD-10-CM | POA: Diagnosis not present

## 2016-12-16 DIAGNOSIS — S329XXA Fracture of unspecified parts of lumbosacral spine and pelvis, initial encounter for closed fracture: Secondary | ICD-10-CM | POA: Diagnosis not present

## 2016-12-16 DIAGNOSIS — M545 Low back pain: Secondary | ICD-10-CM | POA: Diagnosis not present

## 2016-12-16 DIAGNOSIS — F0390 Unspecified dementia without behavioral disturbance: Secondary | ICD-10-CM | POA: Diagnosis not present

## 2016-12-16 DIAGNOSIS — R262 Difficulty in walking, not elsewhere classified: Secondary | ICD-10-CM | POA: Diagnosis not present

## 2016-12-16 DIAGNOSIS — Z4789 Encounter for other orthopedic aftercare: Secondary | ICD-10-CM | POA: Diagnosis not present

## 2016-12-16 DIAGNOSIS — M6281 Muscle weakness (generalized): Secondary | ICD-10-CM | POA: Diagnosis not present

## 2016-12-17 ENCOUNTER — Encounter (INDEPENDENT_AMBULATORY_CARE_PROVIDER_SITE_OTHER): Payer: Medicare Other | Admitting: Ophthalmology

## 2016-12-19 DIAGNOSIS — S329XXA Fracture of unspecified parts of lumbosacral spine and pelvis, initial encounter for closed fracture: Secondary | ICD-10-CM | POA: Diagnosis not present

## 2016-12-19 DIAGNOSIS — R5381 Other malaise: Secondary | ICD-10-CM | POA: Diagnosis not present

## 2016-12-19 DIAGNOSIS — W19XXXD Unspecified fall, subsequent encounter: Secondary | ICD-10-CM | POA: Diagnosis not present

## 2016-12-19 DIAGNOSIS — Z4789 Encounter for other orthopedic aftercare: Secondary | ICD-10-CM | POA: Diagnosis not present

## 2016-12-19 DIAGNOSIS — M545 Low back pain: Secondary | ICD-10-CM | POA: Diagnosis not present

## 2016-12-19 DIAGNOSIS — F0390 Unspecified dementia without behavioral disturbance: Secondary | ICD-10-CM | POA: Diagnosis not present

## 2016-12-19 DIAGNOSIS — R262 Difficulty in walking, not elsewhere classified: Secondary | ICD-10-CM | POA: Diagnosis not present

## 2016-12-19 DIAGNOSIS — M6281 Muscle weakness (generalized): Secondary | ICD-10-CM | POA: Diagnosis not present

## 2016-12-23 DIAGNOSIS — M6281 Muscle weakness (generalized): Secondary | ICD-10-CM | POA: Diagnosis not present

## 2016-12-23 DIAGNOSIS — R5381 Other malaise: Secondary | ICD-10-CM | POA: Diagnosis not present

## 2016-12-23 DIAGNOSIS — W19XXXD Unspecified fall, subsequent encounter: Secondary | ICD-10-CM | POA: Diagnosis not present

## 2016-12-23 DIAGNOSIS — Z4789 Encounter for other orthopedic aftercare: Secondary | ICD-10-CM | POA: Diagnosis not present

## 2016-12-23 DIAGNOSIS — R262 Difficulty in walking, not elsewhere classified: Secondary | ICD-10-CM | POA: Diagnosis not present

## 2016-12-23 DIAGNOSIS — F0390 Unspecified dementia without behavioral disturbance: Secondary | ICD-10-CM | POA: Diagnosis not present

## 2016-12-23 DIAGNOSIS — S329XXA Fracture of unspecified parts of lumbosacral spine and pelvis, initial encounter for closed fracture: Secondary | ICD-10-CM | POA: Diagnosis not present

## 2016-12-23 DIAGNOSIS — M545 Low back pain: Secondary | ICD-10-CM | POA: Diagnosis not present

## 2016-12-26 DIAGNOSIS — W19XXXD Unspecified fall, subsequent encounter: Secondary | ICD-10-CM | POA: Diagnosis not present

## 2016-12-26 DIAGNOSIS — F0390 Unspecified dementia without behavioral disturbance: Secondary | ICD-10-CM | POA: Diagnosis not present

## 2016-12-26 DIAGNOSIS — M545 Low back pain: Secondary | ICD-10-CM | POA: Diagnosis not present

## 2016-12-26 DIAGNOSIS — M6281 Muscle weakness (generalized): Secondary | ICD-10-CM | POA: Diagnosis not present

## 2016-12-26 DIAGNOSIS — Z4789 Encounter for other orthopedic aftercare: Secondary | ICD-10-CM | POA: Diagnosis not present

## 2016-12-26 DIAGNOSIS — R5381 Other malaise: Secondary | ICD-10-CM | POA: Diagnosis not present

## 2016-12-26 DIAGNOSIS — R262 Difficulty in walking, not elsewhere classified: Secondary | ICD-10-CM | POA: Diagnosis not present

## 2016-12-26 DIAGNOSIS — S329XXA Fracture of unspecified parts of lumbosacral spine and pelvis, initial encounter for closed fracture: Secondary | ICD-10-CM | POA: Diagnosis not present

## 2016-12-30 DIAGNOSIS — M6281 Muscle weakness (generalized): Secondary | ICD-10-CM | POA: Diagnosis not present

## 2016-12-30 DIAGNOSIS — Z4789 Encounter for other orthopedic aftercare: Secondary | ICD-10-CM | POA: Diagnosis not present

## 2016-12-30 DIAGNOSIS — W19XXXD Unspecified fall, subsequent encounter: Secondary | ICD-10-CM | POA: Diagnosis not present

## 2016-12-30 DIAGNOSIS — S329XXA Fracture of unspecified parts of lumbosacral spine and pelvis, initial encounter for closed fracture: Secondary | ICD-10-CM | POA: Diagnosis not present

## 2016-12-30 DIAGNOSIS — F0390 Unspecified dementia without behavioral disturbance: Secondary | ICD-10-CM | POA: Diagnosis not present

## 2016-12-30 DIAGNOSIS — M545 Low back pain: Secondary | ICD-10-CM | POA: Diagnosis not present

## 2016-12-30 DIAGNOSIS — R5381 Other malaise: Secondary | ICD-10-CM | POA: Diagnosis not present

## 2016-12-30 DIAGNOSIS — R262 Difficulty in walking, not elsewhere classified: Secondary | ICD-10-CM | POA: Diagnosis not present

## 2017-01-12 DIAGNOSIS — F29 Unspecified psychosis not due to a substance or known physiological condition: Secondary | ICD-10-CM | POA: Diagnosis not present

## 2017-01-12 DIAGNOSIS — G301 Alzheimer's disease with late onset: Secondary | ICD-10-CM | POA: Diagnosis not present

## 2017-01-12 DIAGNOSIS — F0281 Dementia in other diseases classified elsewhere with behavioral disturbance: Secondary | ICD-10-CM | POA: Diagnosis not present

## 2017-01-19 DIAGNOSIS — H353213 Exudative age-related macular degeneration, right eye, with inactive scar: Secondary | ICD-10-CM | POA: Diagnosis not present

## 2017-01-19 DIAGNOSIS — H353221 Exudative age-related macular degeneration, left eye, with active choroidal neovascularization: Secondary | ICD-10-CM | POA: Diagnosis not present

## 2017-01-19 DIAGNOSIS — Z961 Presence of intraocular lens: Secondary | ICD-10-CM | POA: Diagnosis not present

## 2017-01-26 DIAGNOSIS — Z23 Encounter for immunization: Secondary | ICD-10-CM | POA: Diagnosis not present

## 2017-02-02 DIAGNOSIS — G301 Alzheimer's disease with late onset: Secondary | ICD-10-CM | POA: Diagnosis not present

## 2017-02-02 DIAGNOSIS — F0281 Dementia in other diseases classified elsewhere with behavioral disturbance: Secondary | ICD-10-CM | POA: Diagnosis not present

## 2017-02-02 DIAGNOSIS — F29 Unspecified psychosis not due to a substance or known physiological condition: Secondary | ICD-10-CM | POA: Diagnosis not present

## 2017-02-02 DIAGNOSIS — F411 Generalized anxiety disorder: Secondary | ICD-10-CM | POA: Diagnosis not present

## 2017-02-23 DIAGNOSIS — G301 Alzheimer's disease with late onset: Secondary | ICD-10-CM | POA: Diagnosis not present

## 2017-02-23 DIAGNOSIS — F0281 Dementia in other diseases classified elsewhere with behavioral disturbance: Secondary | ICD-10-CM | POA: Diagnosis not present

## 2017-02-23 DIAGNOSIS — F411 Generalized anxiety disorder: Secondary | ICD-10-CM | POA: Diagnosis not present

## 2017-02-23 DIAGNOSIS — F29 Unspecified psychosis not due to a substance or known physiological condition: Secondary | ICD-10-CM | POA: Diagnosis not present

## 2017-03-02 DIAGNOSIS — Z961 Presence of intraocular lens: Secondary | ICD-10-CM | POA: Diagnosis not present

## 2017-03-02 DIAGNOSIS — H5203 Hypermetropia, bilateral: Secondary | ICD-10-CM | POA: Diagnosis not present

## 2017-03-02 DIAGNOSIS — H52203 Unspecified astigmatism, bilateral: Secondary | ICD-10-CM | POA: Diagnosis not present

## 2017-03-02 DIAGNOSIS — H353213 Exudative age-related macular degeneration, right eye, with inactive scar: Secondary | ICD-10-CM | POA: Diagnosis not present

## 2017-03-02 DIAGNOSIS — H353221 Exudative age-related macular degeneration, left eye, with active choroidal neovascularization: Secondary | ICD-10-CM | POA: Diagnosis not present

## 2017-03-02 DIAGNOSIS — H524 Presbyopia: Secondary | ICD-10-CM | POA: Diagnosis not present

## 2017-03-20 DIAGNOSIS — R6883 Chills (without fever): Secondary | ICD-10-CM | POA: Diagnosis not present

## 2017-03-20 DIAGNOSIS — R300 Dysuria: Secondary | ICD-10-CM | POA: Diagnosis not present

## 2017-03-20 DIAGNOSIS — R3915 Urgency of urination: Secondary | ICD-10-CM | POA: Diagnosis not present

## 2017-03-20 DIAGNOSIS — R5383 Other fatigue: Secondary | ICD-10-CM | POA: Diagnosis not present

## 2017-03-23 DIAGNOSIS — N390 Urinary tract infection, site not specified: Secondary | ICD-10-CM | POA: Diagnosis not present

## 2017-03-31 DIAGNOSIS — F411 Generalized anxiety disorder: Secondary | ICD-10-CM | POA: Diagnosis not present

## 2017-03-31 DIAGNOSIS — G301 Alzheimer's disease with late onset: Secondary | ICD-10-CM | POA: Diagnosis not present

## 2017-03-31 DIAGNOSIS — F329 Major depressive disorder, single episode, unspecified: Secondary | ICD-10-CM | POA: Diagnosis not present

## 2017-03-31 DIAGNOSIS — F0281 Dementia in other diseases classified elsewhere with behavioral disturbance: Secondary | ICD-10-CM | POA: Diagnosis not present

## 2017-04-20 DIAGNOSIS — H524 Presbyopia: Secondary | ICD-10-CM | POA: Diagnosis not present

## 2017-04-20 DIAGNOSIS — H6123 Impacted cerumen, bilateral: Secondary | ICD-10-CM | POA: Diagnosis not present

## 2017-04-20 DIAGNOSIS — H903 Sensorineural hearing loss, bilateral: Secondary | ICD-10-CM | POA: Diagnosis not present

## 2017-04-20 DIAGNOSIS — H353213 Exudative age-related macular degeneration, right eye, with inactive scar: Secondary | ICD-10-CM | POA: Diagnosis not present

## 2017-04-20 DIAGNOSIS — Z961 Presence of intraocular lens: Secondary | ICD-10-CM | POA: Diagnosis not present

## 2017-04-20 DIAGNOSIS — H52203 Unspecified astigmatism, bilateral: Secondary | ICD-10-CM | POA: Diagnosis not present

## 2017-04-20 DIAGNOSIS — H353221 Exudative age-related macular degeneration, left eye, with active choroidal neovascularization: Secondary | ICD-10-CM | POA: Diagnosis not present

## 2017-04-20 DIAGNOSIS — H5203 Hypermetropia, bilateral: Secondary | ICD-10-CM | POA: Diagnosis not present

## 2017-04-30 DIAGNOSIS — Z888 Allergy status to other drugs, medicaments and biological substances status: Secondary | ICD-10-CM | POA: Diagnosis not present

## 2017-04-30 DIAGNOSIS — Z7982 Long term (current) use of aspirin: Secondary | ICD-10-CM | POA: Diagnosis not present

## 2017-04-30 DIAGNOSIS — Z66 Do not resuscitate: Secondary | ICD-10-CM | POA: Diagnosis present

## 2017-04-30 DIAGNOSIS — I10 Essential (primary) hypertension: Secondary | ICD-10-CM | POA: Diagnosis not present

## 2017-04-30 DIAGNOSIS — I611 Nontraumatic intracerebral hemorrhage in hemisphere, cortical: Secondary | ICD-10-CM | POA: Diagnosis not present

## 2017-04-30 DIAGNOSIS — R531 Weakness: Secondary | ICD-10-CM | POA: Diagnosis not present

## 2017-04-30 DIAGNOSIS — G309 Alzheimer's disease, unspecified: Secondary | ICD-10-CM | POA: Diagnosis present

## 2017-04-30 DIAGNOSIS — R401 Stupor: Secondary | ICD-10-CM | POA: Diagnosis not present

## 2017-04-30 DIAGNOSIS — Z881 Allergy status to other antibiotic agents status: Secondary | ICD-10-CM | POA: Diagnosis not present

## 2017-04-30 DIAGNOSIS — R4182 Altered mental status, unspecified: Secondary | ICD-10-CM | POA: Diagnosis not present

## 2017-04-30 DIAGNOSIS — Z880 Allergy status to penicillin: Secondary | ICD-10-CM | POA: Diagnosis not present

## 2017-04-30 DIAGNOSIS — R29719 NIHSS score 19: Secondary | ICD-10-CM | POA: Diagnosis present

## 2017-04-30 DIAGNOSIS — Z515 Encounter for palliative care: Secondary | ICD-10-CM | POA: Diagnosis present

## 2017-04-30 DIAGNOSIS — F0280 Dementia in other diseases classified elsewhere without behavioral disturbance: Secondary | ICD-10-CM | POA: Diagnosis present

## 2017-04-30 DIAGNOSIS — R296 Repeated falls: Secondary | ICD-10-CM | POA: Diagnosis present

## 2017-04-30 DIAGNOSIS — I629 Nontraumatic intracranial hemorrhage, unspecified: Secondary | ICD-10-CM | POA: Diagnosis not present

## 2017-04-30 DIAGNOSIS — I619 Nontraumatic intracerebral hemorrhage, unspecified: Secondary | ICD-10-CM | POA: Diagnosis present

## 2017-04-30 DIAGNOSIS — Z7401 Bed confinement status: Secondary | ICD-10-CM | POA: Diagnosis not present

## 2017-04-30 DIAGNOSIS — D72829 Elevated white blood cell count, unspecified: Secondary | ICD-10-CM | POA: Diagnosis not present

## 2017-04-30 DIAGNOSIS — R29810 Facial weakness: Secondary | ICD-10-CM | POA: Diagnosis not present

## 2017-04-30 DIAGNOSIS — I6789 Other cerebrovascular disease: Secondary | ICD-10-CM | POA: Diagnosis not present

## 2017-04-30 DIAGNOSIS — I4891 Unspecified atrial fibrillation: Secondary | ICD-10-CM | POA: Diagnosis not present

## 2017-04-30 DIAGNOSIS — G8194 Hemiplegia, unspecified affecting left nondominant side: Secondary | ICD-10-CM | POA: Diagnosis present

## 2017-04-30 DIAGNOSIS — M2550 Pain in unspecified joint: Secondary | ICD-10-CM | POA: Diagnosis not present

## 2017-04-30 DIAGNOSIS — R402412 Glasgow coma scale score 13-15, at arrival to emergency department: Secondary | ICD-10-CM | POA: Diagnosis present

## 2017-04-30 DIAGNOSIS — Z79899 Other long term (current) drug therapy: Secondary | ICD-10-CM | POA: Diagnosis not present

## 2017-04-30 DIAGNOSIS — Z9181 History of falling: Secondary | ICD-10-CM | POA: Diagnosis not present

## 2017-04-30 DIAGNOSIS — G939 Disorder of brain, unspecified: Secondary | ICD-10-CM | POA: Diagnosis not present

## 2017-05-22 DEATH — deceased

## 2018-11-08 ENCOUNTER — Encounter: Payer: Self-pay | Admitting: *Deleted
